# Patient Record
Sex: Female | Born: 1995 | Race: White | Hispanic: No | Smoking: Never smoker
Health system: Southern US, Community
[De-identification: ages and names within clinical notes are randomized; demographics above are authoritative.]

## PROBLEM LIST (undated history)

## (undated) DIAGNOSIS — D693 Immune thrombocytopenic purpura: Secondary | ICD-10-CM

## (undated) DIAGNOSIS — T1490XA Injury, unspecified, initial encounter: Secondary | ICD-10-CM

## (undated) DIAGNOSIS — D759 Disease of blood and blood-forming organs, unspecified: Secondary | ICD-10-CM

## (undated) DIAGNOSIS — Z8489 Family history of other specified conditions: Secondary | ICD-10-CM

## (undated) DIAGNOSIS — Z862 Personal history of diseases of the blood and blood-forming organs and certain disorders involving the immune mechanism: Secondary | ICD-10-CM

## (undated) DIAGNOSIS — D696 Thrombocytopenia, unspecified: Secondary | ICD-10-CM

## (undated) HISTORY — PX: ABSCESS DRAINAGE: SHX1119

## (undated) HISTORY — DX: Immune thrombocytopenic purpura: D69.3

## (undated) HISTORY — DX: Thrombocytopenia, unspecified: D69.6

## (undated) HISTORY — DX: Disease of blood and blood-forming organs, unspecified: D75.9

## (undated) HISTORY — PX: TONSILLECTOMY: SHX5217

## (undated) HISTORY — DX: Injury, unspecified, initial encounter: T14.90XA

---
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
## 2013-02-19 ENCOUNTER — Other Ambulatory Visit: Payer: Self-pay | Admitting: Physician Assistant

## 2013-02-19 DIAGNOSIS — D696 Thrombocytopenia, unspecified: Secondary | ICD-10-CM

## 2013-02-19 HISTORY — DX: Thrombocytopenia, unspecified: D69.6

## 2013-02-19 LAB — CBC WITH DIFFERENTIAL/PLATELET
Basophil %: 0.4 %
Eosinophil #: 0.1 10*3/uL (ref 0.0–0.7)
Eosinophil %: 1.1 %
HCT: 37.9 % (ref 35.0–47.0)
HGB: 12.7 g/dL (ref 12.0–16.0)
Lymphocyte #: 2.4 10*3/uL (ref 1.0–3.6)
Lymphocyte %: 30.6 %
MCV: 92 fL (ref 80–100)
Monocyte #: 0.5 x10 3/mm (ref 0.2–0.9)
Neutrophil #: 4.7 10*3/uL (ref 1.4–6.5)
Neutrophil %: 61.1 %
RBC: 4.1 10*6/uL (ref 3.80–5.20)
RDW: 13.1 % (ref 11.5–14.5)

## 2013-02-19 LAB — PROTIME-INR
INR: 1
Prothrombin Time: 13.5 secs (ref 11.5–14.7)

## 2013-02-21 ENCOUNTER — Ambulatory Visit: Payer: Self-pay | Admitting: Hematology and Oncology

## 2013-02-21 LAB — CBC CANCER CENTER
Basophil #: 0 x10 3/mm (ref 0.0–0.1)
Eosinophil %: 1.2 %
HCT: 36.8 % (ref 35.0–47.0)
Lymphocyte #: 1.7 x10 3/mm (ref 1.0–3.6)
Lymphocyte %: 24.7 %
MCHC: 34.9 g/dL (ref 32.0–36.0)
MCV: 92 fL (ref 80–100)
Monocyte #: 0.3 x10 3/mm (ref 0.2–0.9)
Neutrophil #: 4.7 x10 3/mm (ref 1.4–6.5)
Neutrophil %: 69.6 %
Platelet: 34 x10 3/mm — ABNORMAL LOW (ref 150–440)
RDW: 13 % (ref 11.5–14.5)
WBC: 6.7 x10 3/mm (ref 3.6–11.0)

## 2013-02-23 ENCOUNTER — Emergency Department: Payer: Self-pay | Admitting: Emergency Medicine

## 2013-02-23 LAB — CBC
MCH: 30.5 pg (ref 26.0–34.0)
MCV: 91 fL (ref 80–100)
RDW: 12.7 % (ref 11.5–14.5)
WBC: 10.6 10*3/uL (ref 3.6–11.0)

## 2013-02-26 LAB — CANCER CTR PLATELET CT: Platelet: 101 x10 3/mm — ABNORMAL LOW (ref 150–440)

## 2013-03-01 LAB — CBC CANCER CENTER
Eosinophil %: 0.4 %
HGB: 15.2 g/dL (ref 12.0–16.0)
Lymphocyte #: 1.9 x10 3/mm (ref 1.0–3.6)
Lymphocyte %: 15.5 %
MCH: 32 pg (ref 26.0–34.0)
MCV: 93 fL (ref 80–100)
Monocyte %: 4.6 %
Neutrophil #: 9.9 x10 3/mm — ABNORMAL HIGH (ref 1.4–6.5)
Neutrophil %: 79.3 %
Platelet: 224 x10 3/mm (ref 150–440)
RBC: 4.77 10*6/uL (ref 3.80–5.20)
RDW: 13.2 % (ref 11.5–14.5)
WBC: 12.5 x10 3/mm — ABNORMAL HIGH (ref 3.6–11.0)

## 2013-03-05 LAB — CBC CANCER CENTER
Eosinophil #: 0.1 x10 3/mm (ref 0.0–0.7)
HCT: 40.4 % (ref 35.0–47.0)
Lymphocyte %: 28 %
MCH: 31.7 pg (ref 26.0–34.0)
MCHC: 34 g/dL (ref 32.0–36.0)
Monocyte #: 0.7 x10 3/mm (ref 0.2–0.9)
Neutrophil #: 7.3 x10 3/mm — ABNORMAL HIGH (ref 1.4–6.5)
RBC: 4.32 10*6/uL (ref 3.80–5.20)
RDW: 13.8 % (ref 11.5–14.5)
WBC: 11.3 x10 3/mm — ABNORMAL HIGH (ref 3.6–11.0)

## 2013-03-16 ENCOUNTER — Ambulatory Visit: Payer: Self-pay | Admitting: Hematology and Oncology

## 2013-03-23 LAB — CBC CANCER CENTER
Basophil #: 0 x10 3/mm (ref 0.0–0.1)
Basophil %: 0.5 %
Eosinophil #: 0.1 x10 3/mm (ref 0.0–0.7)
Eosinophil %: 2.5 %
Lymphocyte %: 28.9 %
MCV: 92 fL (ref 80–100)
Monocyte #: 0.4 x10 3/mm (ref 0.2–0.9)
Monocyte %: 7.9 %
Neutrophil %: 60.2 %
Platelet: 60 x10 3/mm — ABNORMAL LOW (ref 150–440)

## 2013-03-27 LAB — CBC CANCER CENTER
Basophil #: 0 x10 3/mm (ref 0.0–0.1)
Basophil %: 0.5 %
Eosinophil %: 0.6 %
HGB: 13.7 g/dL (ref 12.0–16.0)
Lymphocyte #: 4.5 x10 3/mm — ABNORMAL HIGH (ref 1.0–3.6)
MCHC: 34.2 g/dL (ref 32.0–36.0)
MCV: 93 fL (ref 80–100)
Monocyte %: 8.6 %
Neutrophil #: 5.2 x10 3/mm (ref 1.4–6.5)
RBC: 4.29 10*6/uL (ref 3.80–5.20)
RDW: 13.6 % (ref 11.5–14.5)
WBC: 10.8 x10 3/mm (ref 3.6–11.0)

## 2013-03-29 LAB — CBC CANCER CENTER
Eosinophil #: 0.1 x10 3/mm (ref 0.0–0.7)
Eosinophil %: 1.1 %
HGB: 13.8 g/dL (ref 12.0–16.0)
Lymphocyte %: 28.7 %
Monocyte #: 1.1 x10 3/mm — ABNORMAL HIGH (ref 0.2–0.9)
Monocyte %: 8.6 %
Neutrophil #: 7.6 x10 3/mm — ABNORMAL HIGH (ref 1.4–6.5)
Neutrophil %: 61.2 %
Platelet: 163 x10 3/mm (ref 150–440)
RBC: 4.32 10*6/uL (ref 3.80–5.20)
RDW: 13.7 % (ref 11.5–14.5)

## 2013-04-02 LAB — CBC CANCER CENTER
Basophil #: 0.1 x10 3/mm (ref 0.0–0.1)
Basophil %: 0.5 %
Eosinophil #: 0.1 x10 3/mm (ref 0.0–0.7)
HCT: 40.8 % (ref 35.0–47.0)
HGB: 14 g/dL (ref 12.0–16.0)
Lymphocyte #: 5.7 x10 3/mm — ABNORMAL HIGH (ref 1.0–3.6)
MCH: 31.7 pg (ref 26.0–34.0)
Monocyte #: 1.1 x10 3/mm — ABNORMAL HIGH (ref 0.2–0.9)
Monocyte %: 6.8 %
RDW: 13.6 % (ref 11.5–14.5)
WBC: 15.5 x10 3/mm — ABNORMAL HIGH (ref 3.6–11.0)

## 2013-04-05 LAB — CBC CANCER CENTER
Basophil %: 0.3 %
Eosinophil #: 0 x10 3/mm (ref 0.0–0.7)
Eosinophil %: 0.3 %
HCT: 40.6 % (ref 35.0–47.0)
HGB: 13.7 g/dL (ref 12.0–16.0)
Lymphocyte #: 4.1 x10 3/mm — ABNORMAL HIGH (ref 1.0–3.6)
MCV: 93 fL (ref 80–100)
Monocyte #: 0.8 x10 3/mm (ref 0.2–0.9)
Neutrophil #: 11.9 x10 3/mm — ABNORMAL HIGH (ref 1.4–6.5)
Neutrophil %: 70.2 %
Platelet: 219 x10 3/mm (ref 150–440)
WBC: 16.9 x10 3/mm — ABNORMAL HIGH (ref 3.6–11.0)

## 2013-04-12 LAB — CBC CANCER CENTER
Basophil #: 0.1 x10 3/mm (ref 0.0–0.1)
Eosinophil #: 0.1 x10 3/mm (ref 0.0–0.7)
Eosinophil %: 0.9 %
HGB: 12.6 g/dL (ref 12.0–16.0)
Lymphocyte #: 4.1 x10 3/mm — ABNORMAL HIGH (ref 1.0–3.6)
MCH: 30.9 pg (ref 26.0–34.0)
MCV: 92 fL (ref 80–100)
Neutrophil %: 55.6 %
Platelet: 172 x10 3/mm (ref 150–440)
RDW: 13.5 % (ref 11.5–14.5)

## 2013-04-12 LAB — BASIC METABOLIC PANEL
BUN: 8 mg/dL (ref 7–18)
Chloride: 103 mmol/L (ref 98–107)
Co2: 29 mmol/L (ref 21–32)
Creatinine: 0.74 mg/dL (ref 0.60–1.30)
EGFR (Non-African Amer.): >60
Glucose: 80 mg/dL (ref 65–99)
Osmolality: 273 (ref 275–301)
Potassium: 4 mmol/L (ref 3.5–5.1)

## 2013-04-16 ENCOUNTER — Ambulatory Visit: Payer: Self-pay | Admitting: Hematology and Oncology

## 2013-04-18 LAB — CBC CANCER CENTER
Basophil #: 0 x10 3/mm (ref 0.0–0.1)
Basophil %: 0.4 %
Eosinophil #: 0.2 x10 3/mm (ref 0.0–0.7)
Lymphocyte #: 3.8 x10 3/mm — ABNORMAL HIGH (ref 1.0–3.6)
Lymphocyte %: 40 %
Monocyte #: 0.7 x10 3/mm (ref 0.2–0.9)
Neutrophil #: 4.8 x10 3/mm (ref 1.4–6.5)
Neutrophil %: 50.6 %
Platelet: 118 x10 3/mm — ABNORMAL LOW (ref 150–440)
RBC: 4.06 10*6/uL (ref 3.80–5.20)
RDW: 13.7 % (ref 11.5–14.5)
WBC: 9.6 x10 3/mm (ref 3.6–11.0)

## 2013-04-25 LAB — CBC CANCER CENTER
Basophil %: 0.6 %
Eosinophil #: 0.2 x10 3/mm (ref 0.0–0.7)
Lymphocyte #: 2.4 x10 3/mm (ref 1.0–3.6)
Lymphocyte %: 24.3 %
MCH: 30.9 pg (ref 26.0–34.0)
MCHC: 33.5 g/dL (ref 32.0–36.0)
MCV: 92 fL (ref 80–100)
Monocyte %: 7.9 %
Neutrophil #: 6.3 x10 3/mm (ref 1.4–6.5)
Neutrophil %: 65 %
Platelet: 133 x10 3/mm — ABNORMAL LOW (ref 150–440)
RBC: 4.55 10*6/uL (ref 3.80–5.20)
RDW: 13.7 % (ref 11.5–14.5)
WBC: 9.7 x10 3/mm (ref 3.6–11.0)

## 2013-05-02 LAB — CBC CANCER CENTER
Basophil #: 0 x10 3/mm (ref 0.0–0.1)
Basophil %: 0.2 %
HGB: 13.5 g/dL (ref 12.0–16.0)
MCHC: 33.5 g/dL (ref 32.0–36.0)
MCV: 92 fL (ref 80–100)
Monocyte #: 0.3 x10 3/mm (ref 0.2–0.9)
Monocyte %: 3 %
Neutrophil #: 8.7 x10 3/mm — ABNORMAL HIGH (ref 1.4–6.5)
Neutrophil %: 83.7 %
RDW: 13.4 % (ref 11.5–14.5)

## 2013-05-04 LAB — CBC CANCER CENTER
Basophil #: 0.1 x10 3/mm (ref 0.0–0.1)
Eosinophil #: 0 x10 3/mm (ref 0.0–0.7)
Eosinophil %: 0.2 %
Lymphocyte %: 37.4 %
MCH: 31.3 pg (ref 26.0–34.0)
MCHC: 33.9 g/dL (ref 32.0–36.0)
MCV: 92 fL (ref 80–100)
Monocyte #: 0.9 x10 3/mm (ref 0.2–0.9)
Neutrophil %: 53.8 %
RBC: 4.04 10*6/uL (ref 3.80–5.20)
RDW: 13.6 % (ref 11.5–14.5)
WBC: 11.3 x10 3/mm — ABNORMAL HIGH (ref 3.6–11.0)

## 2013-05-10 LAB — CBC CANCER CENTER
Eosinophil #: 0.1 x10 3/mm (ref 0.0–0.7)
MCHC: 35 g/dL (ref 32.0–36.0)
Neutrophil %: 57.6 %
RBC: 4.31 10*6/uL (ref 3.80–5.20)
RDW: 13.3 % (ref 11.5–14.5)
WBC: 7.5 x10 3/mm (ref 3.6–11.0)

## 2013-05-16 ENCOUNTER — Ambulatory Visit: Payer: Self-pay | Admitting: Hematology and Oncology

## 2013-05-17 LAB — CBC CANCER CENTER
Eosinophil %: 3.3 %
HCT: 36.8 % (ref 35.0–47.0)
HGB: 12.6 g/dL (ref 12.0–16.0)
Lymphocyte #: 2.1 x10 3/mm (ref 1.0–3.6)
MCHC: 34.4 g/dL (ref 32.0–36.0)
MCV: 92 fL (ref 80–100)
Monocyte #: 0.7 x10 3/mm (ref 0.2–0.9)
Monocyte %: 11.4 %
Neutrophil #: 3.4 x10 3/mm (ref 1.4–6.5)
Neutrophil %: 51.8 %
Platelet: 150 x10 3/mm (ref 150–440)
RBC: 4 10*6/uL (ref 3.80–5.20)
RDW: 13.2 % (ref 11.5–14.5)
WBC: 6.6 x10 3/mm (ref 3.6–11.0)

## 2013-05-23 ENCOUNTER — Emergency Department: Payer: Self-pay | Admitting: Emergency Medicine

## 2013-05-23 LAB — URINALYSIS, COMPLETE
Bilirubin,UR: NEGATIVE
Glucose,UR: NEGATIVE mg/dL (ref 0–75)
Leukocyte Esterase: NEGATIVE
Ph: 5 (ref 4.5–8.0)
RBC,UR: 3 /HPF (ref 0–5)
WBC UR: 4 /HPF (ref 0–5)

## 2013-05-23 LAB — CBC WITH DIFFERENTIAL/PLATELET
Basophil #: 0.1 10*3/uL (ref 0.0–0.1)
Eosinophil #: 0.2 10*3/uL (ref 0.0–0.7)
Eosinophil %: 2 %
HCT: 42.9 % (ref 35.0–47.0)
Lymphocyte #: 1.4 10*3/uL (ref 1.0–3.6)
Lymphocyte %: 16 %
MCHC: 34.4 g/dL (ref 32.0–36.0)
MCV: 90 fL (ref 80–100)
Monocyte #: 0.6 x10 3/mm (ref 0.2–0.9)
Neutrophil #: 6.6 10*3/uL — ABNORMAL HIGH (ref 1.4–6.5)
Neutrophil %: 74.1 %
Platelet: 113 10*3/uL — ABNORMAL LOW (ref 150–440)
WBC: 8.9 10*3/uL (ref 3.6–11.0)

## 2013-05-23 LAB — BASIC METABOLIC PANEL
BUN: 8 mg/dL (ref 7–18)
Calcium, Total: 9.3 mg/dL (ref 9.0–10.7)
Chloride: 104 mmol/L (ref 98–107)
Co2: 23 mmol/L (ref 21–32)
Creatinine: 0.67 mg/dL (ref 0.60–1.30)
EGFR (African American): >60
EGFR (Non-African Amer.): >60
Potassium: 4 mmol/L (ref 3.5–5.1)
Sodium: 134 mmol/L — ABNORMAL LOW (ref 136–145)

## 2013-05-25 LAB — BETA STREP CULTURE(ARMC)

## 2013-05-31 LAB — CBC CANCER CENTER
Eosinophil %: 4.6 %
HCT: 38.3 % (ref 35.0–47.0)
Lymphocyte #: 2.2 x10 3/mm (ref 1.0–3.6)
MCHC: 33.8 g/dL (ref 32.0–36.0)
MCV: 91 fL (ref 80–100)
Monocyte #: 0.7 x10 3/mm (ref 0.2–0.9)
Monocyte %: 9.5 %
Neutrophil %: 55.1 %
Platelet: 317 x10 3/mm (ref 150–440)
WBC: 7.5 x10 3/mm (ref 3.6–11.0)

## 2013-06-16 ENCOUNTER — Ambulatory Visit: Payer: Self-pay | Admitting: Hematology and Oncology

## 2013-06-19 LAB — CBC CANCER CENTER
Basophil %: 0.7 %
Eosinophil #: 0.2 x10 3/mm (ref 0.0–0.7)
HGB: 12.6 g/dL (ref 12.0–16.0)
Lymphocyte #: 1.5 x10 3/mm (ref 1.0–3.6)
MCH: 30.8 pg (ref 26.0–34.0)
MCV: 93 fL (ref 80–100)
Monocyte #: 0.5 x10 3/mm (ref 0.2–0.9)
Platelet: 227 x10 3/mm (ref 150–440)
RBC: 4.1 10*6/uL (ref 3.80–5.20)
RDW: 14.4 % (ref 11.5–14.5)
WBC: 5.2 x10 3/mm (ref 3.6–11.0)

## 2013-06-19 LAB — BASIC METABOLIC PANEL
Anion Gap: 9 (ref 7–16)
BUN: 6 mg/dL — ABNORMAL LOW (ref 7–18)
Calcium, Total: 8.7 mg/dL — ABNORMAL LOW (ref 9.0–10.7)
Chloride: 106 mmol/L (ref 98–107)
Co2: 24 mmol/L (ref 21–32)
EGFR (African American): >60
EGFR (Non-African Amer.): >60
Glucose: 90 mg/dL (ref 65–99)
Potassium: 3.8 mmol/L (ref 3.5–5.1)

## 2013-07-16 ENCOUNTER — Ambulatory Visit: Payer: Self-pay | Admitting: Hematology and Oncology

## 2013-07-24 LAB — CBC CANCER CENTER
Basophil #: 0.1 x10 3/mm (ref 0.0–0.1)
Eosinophil %: 2.7 %
HCT: 39.9 % (ref 35.0–47.0)
HGB: 13.3 g/dL (ref 12.0–16.0)
Lymphocyte %: 24.2 %
MCHC: 33.5 g/dL (ref 32.0–36.0)
MCV: 93 fL (ref 80–100)
Monocyte #: 0.6 x10 3/mm (ref 0.2–0.9)
Monocyte %: 9.2 %
Neutrophil %: 63.2 %
Platelet: 242 x10 3/mm (ref 150–440)
RBC: 4.29 10*6/uL (ref 3.80–5.20)
RDW: 13.7 % (ref 11.5–14.5)
WBC: 6.8 x10 3/mm (ref 3.6–11.0)

## 2013-08-16 ENCOUNTER — Ambulatory Visit: Payer: Self-pay | Admitting: Hematology and Oncology

## 2013-09-02 ENCOUNTER — Emergency Department: Payer: Self-pay | Admitting: Emergency Medicine

## 2013-09-02 LAB — CBC
HCT: 37.9 % (ref 35.0–47.0)
HGB: 12.8 g/dL (ref 12.0–16.0)
MCH: 30.6 pg (ref 26.0–34.0)
MCHC: 33.8 g/dL (ref 32.0–36.0)
MCV: 91 fL (ref 80–100)
PLATELETS: 288 10*3/uL (ref 150–440)
RBC: 4.19 10*6/uL (ref 3.80–5.20)
RDW: 12.6 % (ref 11.5–14.5)
WBC: 11.5 10*3/uL — ABNORMAL HIGH (ref 3.6–11.0)

## 2013-09-02 LAB — COMPREHENSIVE METABOLIC PANEL
Albumin: 3.2 g/dL — ABNORMAL LOW (ref 3.8–5.6)
Alkaline Phosphatase: 73 U/L
Anion Gap: 7 (ref 7–16)
BUN: 14 mg/dL (ref 7–18)
Bilirubin,Total: 0.2 mg/dL (ref 0.2–1.0)
CALCIUM: 8.9 mg/dL — AB (ref 9.0–10.7)
CO2: 24 mmol/L (ref 21–32)
Chloride: 106 mmol/L (ref 98–107)
Creatinine: 0.73 mg/dL (ref 0.60–1.30)
EGFR (African American): >60
Glucose: 96 mg/dL (ref 65–99)
OSMOLALITY: 274 (ref 275–301)
Potassium: 3.8 mmol/L (ref 3.5–5.1)
SGOT(AST): 22 U/L (ref 0–26)
SGPT (ALT): 20 U/L (ref 12–78)
SODIUM: 137 mmol/L (ref 136–145)
Total Protein: 7.6 g/dL (ref 6.4–8.6)

## 2013-11-01 ENCOUNTER — Ambulatory Visit: Payer: Self-pay | Admitting: Hematology and Oncology

## 2013-11-02 LAB — CBC CANCER CENTER
Basophil #: 0 x10 3/mm (ref 0.0–0.1)
Basophil %: 0.6 %
EOS ABS: 0.1 x10 3/mm (ref 0.0–0.7)
Eosinophil %: 1.8 %
HCT: 40.4 % (ref 35.0–47.0)
HGB: 13.3 g/dL (ref 12.0–16.0)
LYMPHS ABS: 1.5 x10 3/mm (ref 1.0–3.6)
LYMPHS PCT: 19.3 %
MCH: 30.1 pg (ref 26.0–34.0)
MCHC: 32.9 g/dL (ref 32.0–36.0)
MCV: 91 fL (ref 80–100)
Monocyte #: 0.6 x10 3/mm (ref 0.2–0.9)
Monocyte %: 7.6 %
NEUTROS ABS: 5.4 x10 3/mm (ref 1.4–6.5)
Neutrophil %: 70.7 %
Platelet: 271 x10 3/mm (ref 150–440)
RBC: 4.41 10*6/uL (ref 3.80–5.20)
RDW: 13.6 % (ref 11.5–14.5)
WBC: 7.6 x10 3/mm (ref 3.6–11.0)

## 2013-11-02 LAB — BASIC METABOLIC PANEL
ANION GAP: 9 (ref 7–16)
BUN: 9 mg/dL (ref 7–18)
CREATININE: 0.74 mg/dL (ref 0.60–1.30)
Calcium, Total: 9.2 mg/dL (ref 9.0–10.7)
Chloride: 103 mmol/L (ref 98–107)
Co2: 27 mmol/L (ref 21–32)
EGFR (Non-African Amer.): >60
Glucose: 85 mg/dL (ref 65–99)
Osmolality: 275 (ref 275–301)
Potassium: 4.4 mmol/L (ref 3.5–5.1)
Sodium: 139 mmol/L (ref 136–145)

## 2013-11-14 ENCOUNTER — Ambulatory Visit: Payer: Self-pay | Admitting: Hematology and Oncology

## 2013-12-30 ENCOUNTER — Emergency Department: Payer: Self-pay | Admitting: Internal Medicine

## 2013-12-30 LAB — CBC WITH DIFFERENTIAL/PLATELET
BASOS ABS: 0.1 10*3/uL (ref 0.0–0.1)
Basophil %: 0.6 %
EOS ABS: 0.3 10*3/uL (ref 0.0–0.7)
EOS PCT: 3 %
HCT: 39.7 % (ref 35.0–47.0)
HGB: 13.5 g/dL (ref 12.0–16.0)
LYMPHS ABS: 1.6 10*3/uL (ref 1.0–3.6)
Lymphocyte %: 17.5 %
MCH: 31.5 pg (ref 26.0–34.0)
MCHC: 34 g/dL (ref 32.0–36.0)
MCV: 93 fL (ref 80–100)
MONO ABS: 0.6 x10 3/mm (ref 0.2–0.9)
MONOS PCT: 6.4 %
Neutrophil #: 6.8 10*3/uL — ABNORMAL HIGH (ref 1.4–6.5)
Neutrophil %: 72.5 %
Platelet: 236 10*3/uL (ref 150–440)
RBC: 4.28 10*6/uL (ref 3.80–5.20)
RDW: 13.5 % (ref 11.5–14.5)
WBC: 9.4 10*3/uL (ref 3.6–11.0)

## 2013-12-30 LAB — URINALYSIS, COMPLETE
Bacteria: NONE SEEN
Bilirubin,UR: NEGATIVE
GLUCOSE, UR: NEGATIVE mg/dL (ref 0–75)
Leukocyte Esterase: NEGATIVE
NITRITE: NEGATIVE
PH: 6 (ref 4.5–8.0)
PROTEIN: NEGATIVE
RBC,UR: 1 /HPF (ref 0–5)
Specific Gravity: 1.019 (ref 1.003–1.030)
Squamous Epithelial: 2
WBC UR: NONE SEEN /HPF (ref 0–5)

## 2013-12-30 LAB — COMPREHENSIVE METABOLIC PANEL
ALBUMIN: 3.5 g/dL — AB (ref 3.8–5.6)
ALK PHOS: 80 U/L
ALT: 19 U/L (ref 12–78)
ANION GAP: 6 — AB (ref 7–16)
BUN: 6 mg/dL — ABNORMAL LOW (ref 7–18)
Bilirubin,Total: 0.4 mg/dL (ref 0.2–1.0)
CALCIUM: 9.1 mg/dL (ref 9.0–10.7)
CO2: 24 mmol/L (ref 21–32)
Chloride: 107 mmol/L (ref 98–107)
Creatinine: 0.79 mg/dL (ref 0.60–1.30)
Glucose: 113 mg/dL — ABNORMAL HIGH (ref 65–99)
Osmolality: 272 (ref 275–301)
POTASSIUM: 3.6 mmol/L (ref 3.5–5.1)
SGOT(AST): 24 U/L (ref 0–26)
Sodium: 137 mmol/L (ref 136–145)
TOTAL PROTEIN: 7.7 g/dL (ref 6.4–8.6)

## 2014-02-04 ENCOUNTER — Ambulatory Visit: Payer: Self-pay | Admitting: Hematology and Oncology

## 2014-02-05 LAB — CBC CANCER CENTER
Basophil #: 0 x10 3/mm (ref 0.0–0.1)
Basophil %: 0.7 %
EOS ABS: 0.1 x10 3/mm (ref 0.0–0.7)
Eosinophil %: 1.8 %
HCT: 41.4 % (ref 35.0–47.0)
HGB: 13.5 g/dL (ref 12.0–16.0)
Lymphocyte #: 1.8 x10 3/mm (ref 1.0–3.6)
Lymphocyte %: 27 %
MCH: 31 pg (ref 26.0–34.0)
MCHC: 32.7 g/dL (ref 32.0–36.0)
MCV: 95 fL (ref 80–100)
MONOS PCT: 7.4 %
Monocyte #: 0.5 x10 3/mm (ref 0.2–0.9)
Neutrophil #: 4.3 x10 3/mm (ref 1.4–6.5)
Neutrophil %: 63.1 %
PLATELETS: 256 x10 3/mm (ref 150–440)
RBC: 4.37 10*6/uL (ref 3.80–5.20)
RDW: 13.3 % (ref 11.5–14.5)
WBC: 6.8 x10 3/mm (ref 3.6–11.0)

## 2014-02-05 LAB — BASIC METABOLIC PANEL
Anion Gap: 7 (ref 7–16)
BUN: 7 mg/dL (ref 7–18)
CALCIUM: 9.7 mg/dL (ref 9.0–10.7)
Chloride: 105 mmol/L (ref 98–107)
Co2: 28 mmol/L (ref 21–32)
Creatinine: 0.59 mg/dL — ABNORMAL LOW (ref 0.60–1.30)
EGFR (African American): >60
EGFR (Non-African Amer.): >60
Glucose: 78 mg/dL (ref 65–99)
OSMOLALITY: 276 (ref 275–301)
Potassium: 4.4 mmol/L (ref 3.5–5.1)
Sodium: 140 mmol/L (ref 136–145)

## 2014-02-13 ENCOUNTER — Ambulatory Visit: Payer: Self-pay | Admitting: Hematology and Oncology

## 2014-04-05 ENCOUNTER — Emergency Department: Payer: Self-pay | Admitting: Internal Medicine

## 2014-04-17 ENCOUNTER — Emergency Department: Payer: Self-pay | Admitting: Emergency Medicine

## 2014-04-17 LAB — URINALYSIS, COMPLETE
BACTERIA: NONE SEEN
BILIRUBIN, UR: NEGATIVE
Blood: NEGATIVE
GLUCOSE, UR: NEGATIVE mg/dL (ref 0–75)
LEUKOCYTE ESTERASE: NEGATIVE
NITRITE: NEGATIVE
PROTEIN: NEGATIVE
Ph: 6 (ref 4.5–8.0)
RBC,UR: 3 /HPF (ref 0–5)
Specific Gravity: 1.025 (ref 1.003–1.030)

## 2014-04-17 LAB — BASIC METABOLIC PANEL
ANION GAP: 7 (ref 7–16)
BUN: 9 mg/dL (ref 7–18)
Calcium, Total: 8.6 mg/dL (ref 8.5–10.1)
Chloride: 106 mmol/L (ref 98–107)
Co2: 26 mmol/L (ref 21–32)
Creatinine: 0.74 mg/dL (ref 0.60–1.30)
EGFR (African American): >60
EGFR (Non-African Amer.): >60
Glucose: 95 mg/dL (ref 65–99)
Osmolality: 276 (ref 275–301)
Potassium: 4 mmol/L (ref 3.5–5.1)
SODIUM: 139 mmol/L (ref 136–145)

## 2014-04-17 LAB — CBC WITH DIFFERENTIAL/PLATELET
Basophil #: 0.1 10*3/uL (ref 0.0–0.1)
Basophil %: 0.5 %
EOS ABS: 0 10*3/uL (ref 0.0–0.7)
EOS PCT: 0.4 %
HCT: 39.7 % (ref 35.0–47.0)
HGB: 12.9 g/dL (ref 12.0–16.0)
LYMPHS ABS: 1.2 10*3/uL (ref 1.0–3.6)
Lymphocyte %: 11.1 %
MCH: 31.1 pg (ref 26.0–34.0)
MCHC: 32.6 g/dL (ref 32.0–36.0)
MCV: 96 fL (ref 80–100)
MONO ABS: 0.4 x10 3/mm (ref 0.2–0.9)
Monocyte %: 3.4 %
Neutrophil #: 9 10*3/uL — ABNORMAL HIGH (ref 1.4–6.5)
Neutrophil %: 84.6 %
Platelet: 219 10*3/uL (ref 150–440)
RBC: 4.15 10*6/uL (ref 3.80–5.20)
RDW: 13.1 % (ref 11.5–14.5)
WBC: 10.7 10*3/uL (ref 3.6–11.0)

## 2014-04-18 ENCOUNTER — Ambulatory Visit: Payer: Self-pay | Admitting: Hematology and Oncology

## 2014-04-18 LAB — CBC CANCER CENTER
Basophil #: 0.1 x10 3/mm (ref 0.0–0.1)
Basophil %: 0.7 %
EOS ABS: 0.1 x10 3/mm (ref 0.0–0.7)
EOS PCT: 1.2 %
HCT: 39.9 % (ref 35.0–47.0)
HGB: 13.1 g/dL (ref 12.0–16.0)
LYMPHS ABS: 2.3 x10 3/mm (ref 1.0–3.6)
Lymphocyte %: 28.2 %
MCH: 31.3 pg (ref 26.0–34.0)
MCHC: 32.8 g/dL (ref 32.0–36.0)
MCV: 96 fL (ref 80–100)
MONOS PCT: 4.9 %
Monocyte #: 0.4 x10 3/mm (ref 0.2–0.9)
NEUTROS ABS: 5.2 x10 3/mm (ref 1.4–6.5)
NEUTROS PCT: 65 %
PLATELETS: 217 x10 3/mm (ref 150–440)
RBC: 4.18 10*6/uL (ref 3.80–5.20)
RDW: 13.4 % (ref 11.5–14.5)
WBC: 8 x10 3/mm (ref 3.6–11.0)

## 2014-04-18 LAB — COMPREHENSIVE METABOLIC PANEL
ALK PHOS: 65 U/L
AST: 14 U/L — AB (ref 15–37)
Albumin: 3.2 g/dL — ABNORMAL LOW (ref 3.4–5.0)
Anion Gap: 10 (ref 7–16)
BUN: 8 mg/dL (ref 7–18)
Bilirubin,Total: 0.2 mg/dL (ref 0.2–1.0)
CREATININE: 0.87 mg/dL (ref 0.60–1.30)
Calcium, Total: 8.6 mg/dL (ref 8.5–10.1)
Chloride: 104 mmol/L (ref 98–107)
Co2: 27 mmol/L (ref 21–32)
EGFR (African American): >60
EGFR (Non-African Amer.): >60
Glucose: 100 mg/dL — ABNORMAL HIGH (ref 65–99)
Osmolality: 280 (ref 275–301)
POTASSIUM: 4.1 mmol/L (ref 3.5–5.1)
SGPT (ALT): 19 U/L
Sodium: 141 mmol/L (ref 136–145)
Total Protein: 7 g/dL (ref 6.4–8.2)

## 2014-05-13 ENCOUNTER — Emergency Department: Payer: Self-pay | Admitting: Emergency Medicine

## 2014-05-16 ENCOUNTER — Ambulatory Visit: Payer: Self-pay | Admitting: Hematology and Oncology

## 2014-06-15 ENCOUNTER — Emergency Department: Payer: Self-pay | Admitting: Emergency Medicine

## 2014-06-15 LAB — CBC WITH DIFFERENTIAL/PLATELET
Basophil #: 0.1 10*3/uL (ref 0.0–0.1)
Basophil %: 0.8 %
EOS PCT: 1.4 %
Eosinophil #: 0.1 10*3/uL (ref 0.0–0.7)
HCT: 38.2 % (ref 35.0–47.0)
HGB: 12.7 g/dL (ref 12.0–16.0)
Lymphocyte #: 2.6 10*3/uL (ref 1.0–3.6)
Lymphocyte %: 27.5 %
MCH: 31.4 pg (ref 26.0–34.0)
MCHC: 33.2 g/dL (ref 32.0–36.0)
MCV: 95 fL (ref 80–100)
MONO ABS: 0.6 x10 3/mm (ref 0.2–0.9)
Monocyte %: 6.2 %
Neutrophil #: 5.9 10*3/uL (ref 1.4–6.5)
Neutrophil %: 64.1 %
Platelet: 283 10*3/uL (ref 150–440)
RBC: 4.03 10*6/uL (ref 3.80–5.20)
RDW: 13.5 % (ref 11.5–14.5)
WBC: 9.3 10*3/uL (ref 3.6–11.0)

## 2014-06-15 LAB — URINALYSIS, COMPLETE
Bilirubin,UR: NEGATIVE
Blood: NEGATIVE
Glucose,UR: NEGATIVE mg/dL (ref 0–75)
Ketone: NEGATIVE
Leukocyte Esterase: NEGATIVE
Nitrite: NEGATIVE
Ph: 5 (ref 4.5–8.0)
Protein: NEGATIVE
RBC,UR: 2 /HPF (ref 0–5)
SPECIFIC GRAVITY: 1.017 (ref 1.003–1.030)
Squamous Epithelial: 3
WBC UR: 2 /HPF (ref 0–5)

## 2014-06-15 LAB — COMPREHENSIVE METABOLIC PANEL
AST: 64 U/L — AB (ref 15–37)
Albumin: 3.1 g/dL — ABNORMAL LOW (ref 3.4–5.0)
Alkaline Phosphatase: 69 U/L
Anion Gap: 8 (ref 7–16)
BUN: 10 mg/dL (ref 7–18)
Bilirubin,Total: 0.3 mg/dL (ref 0.2–1.0)
CALCIUM: 8.3 mg/dL — AB (ref 8.5–10.1)
CREATININE: 0.34 mg/dL — AB (ref 0.60–1.30)
Chloride: 107 mmol/L (ref 98–107)
Co2: 21 mmol/L (ref 21–32)
EGFR (Non-African Amer.): >60
Glucose: 79 mg/dL (ref 65–99)
Osmolality: 270 (ref 275–301)
Potassium: 5.7 mmol/L — ABNORMAL HIGH (ref 3.5–5.1)
SGPT (ALT): 20 U/L
SODIUM: 136 mmol/L (ref 136–145)
Total Protein: 7.4 g/dL (ref 6.4–8.2)

## 2014-06-15 LAB — LIPASE, BLOOD: LIPASE: 74 U/L (ref 73–393)

## 2014-06-18 ENCOUNTER — Ambulatory Visit: Payer: Self-pay | Admitting: Hematology and Oncology

## 2014-06-18 LAB — CBC CANCER CENTER
BASOS ABS: 0 x10 3/mm (ref 0.0–0.1)
Basophil %: 0.7 %
EOS PCT: 2 %
Eosinophil #: 0.2 x10 3/mm (ref 0.0–0.7)
HCT: 40.3 % (ref 35.0–47.0)
HGB: 13.2 g/dL (ref 12.0–16.0)
Lymphocyte #: 2.1 x10 3/mm (ref 1.0–3.6)
Lymphocyte %: 27.1 %
MCH: 31.2 pg (ref 26.0–34.0)
MCHC: 32.8 g/dL (ref 32.0–36.0)
MCV: 95 fL (ref 80–100)
Monocyte #: 0.4 x10 3/mm (ref 0.2–0.9)
Monocyte %: 5.6 %
NEUTROS PCT: 64.6 %
Neutrophil #: 4.9 x10 3/mm (ref 1.4–6.5)
PLATELETS: 252 x10 3/mm (ref 150–440)
RBC: 4.24 10*6/uL (ref 3.80–5.20)
RDW: 13.4 % (ref 11.5–14.5)
WBC: 7.6 x10 3/mm (ref 3.6–11.0)

## 2014-07-16 ENCOUNTER — Ambulatory Visit: Payer: Self-pay | Admitting: Hematology and Oncology

## 2014-08-22 ENCOUNTER — Emergency Department: Payer: Self-pay | Admitting: Emergency Medicine

## 2014-08-22 LAB — URINALYSIS, COMPLETE
Bilirubin,UR: NEGATIVE
Blood: NEGATIVE
Glucose,UR: NEGATIVE mg/dL (ref 0–75)
Hyaline Cast: 11
Ketone: NEGATIVE
Leukocyte Esterase: NEGATIVE
NITRITE: NEGATIVE
Ph: 5 (ref 4.5–8.0)
RBC,UR: 1 /HPF (ref 0–5)
Specific Gravity: 1.027 (ref 1.003–1.030)
WBC UR: 5 /HPF (ref 0–5)

## 2014-08-22 LAB — BASIC METABOLIC PANEL
Anion Gap: 6 — ABNORMAL LOW (ref 7–16)
BUN: 7 mg/dL (ref 7–18)
Calcium, Total: 8.8 mg/dL (ref 8.5–10.1)
Chloride: 103 mmol/L (ref 98–107)
Co2: 28 mmol/L (ref 21–32)
Creatinine: 0.85 mg/dL (ref 0.60–1.30)
EGFR (African American): >60
GLUCOSE: 106 mg/dL — AB (ref 65–99)
OSMOLALITY: 272 (ref 275–301)
Potassium: 3.9 mmol/L (ref 3.5–5.1)
Sodium: 137 mmol/L (ref 136–145)

## 2014-08-22 LAB — DRUG SCREEN, URINE
AMPHETAMINES, UR SCREEN: NEGATIVE (ref ?–1000)
BENZODIAZEPINE, UR SCRN: NEGATIVE (ref ?–200)
Barbiturates, Ur Screen: NEGATIVE (ref ?–200)
Cannabinoid 50 Ng, Ur ~~LOC~~: POSITIVE (ref ?–50)
Cocaine Metabolite,Ur ~~LOC~~: NEGATIVE (ref ?–300)
MDMA (Ecstasy)Ur Screen: NEGATIVE (ref ?–500)
METHADONE, UR SCREEN: NEGATIVE (ref ?–300)
Opiate, Ur Screen: POSITIVE (ref ?–300)
Phencyclidine (PCP) Ur S: NEGATIVE (ref ?–25)
Tricyclic, Ur Screen: NEGATIVE (ref ?–1000)

## 2014-08-22 LAB — CBC WITH DIFFERENTIAL/PLATELET
Basophil #: 0 10*3/uL (ref 0.0–0.1)
Basophil %: 0.4 %
EOS PCT: 1.4 %
Eosinophil #: 0.1 10*3/uL (ref 0.0–0.7)
HCT: 43.2 % (ref 35.0–47.0)
HGB: 14 g/dL (ref 12.0–16.0)
LYMPHS ABS: 3.7 10*3/uL — AB (ref 1.0–3.6)
Lymphocyte %: 36.9 %
MCH: 31.3 pg (ref 26.0–34.0)
MCHC: 32.5 g/dL (ref 32.0–36.0)
MCV: 96 fL (ref 80–100)
MONO ABS: 0.6 x10 3/mm (ref 0.2–0.9)
MONOS PCT: 6.5 %
NEUTROS PCT: 54.8 %
Neutrophil #: 5.5 10*3/uL (ref 1.4–6.5)
Platelet: 253 10*3/uL (ref 150–440)
RBC: 4.49 10*6/uL (ref 3.80–5.20)
RDW: 13.4 % (ref 11.5–14.5)
WBC: 10 10*3/uL (ref 3.6–11.0)

## 2014-09-17 ENCOUNTER — Emergency Department: Payer: Self-pay | Admitting: Internal Medicine

## 2014-09-20 ENCOUNTER — Emergency Department: Payer: Self-pay | Admitting: Emergency Medicine

## 2014-10-25 ENCOUNTER — Emergency Department: Payer: Self-pay | Admitting: Student

## 2014-10-31 ENCOUNTER — Ambulatory Visit
Admit: 2014-10-31 | Disposition: A | Discharge status: Home / Self Care | Payer: Self-pay | Attending: Hematology and Oncology | Admitting: Hematology and Oncology

## 2014-11-15 ENCOUNTER — Ambulatory Visit
Admit: 2014-11-15 | Disposition: A | Discharge status: Home / Self Care | Payer: Self-pay | Attending: Hematology and Oncology | Admitting: Hematology and Oncology

## 2014-11-26 LAB — CREATININE, SERUM: Creatine, Serum: 0.64

## 2014-12-06 NOTE — Consult Note (Signed)
Brief Consult Note: Diagnosis: ITP.   Comments: Patient platelet ocunt 28 000 today despite 2 days of oral Prednisone. Will switch to methylprednisone 15mg /kg iv for 2 days then Prednisone 80mg  po daily. Recheck CBC on 03/05/13.  Electronic Signatures: Antony Hasteamiah, Levaughn Puccinelli S (MD)  (Signed 11-Jul-14 13:38)  Authored: Brief Consult Note   Last Updated: 11-Jul-14 13:38 by Antony Hasteamiah, Amos Micheals S (MD)

## 2014-12-11 ENCOUNTER — Other Ambulatory Visit: Payer: Self-pay | Admitting: Hematology and Oncology

## 2014-12-11 ENCOUNTER — Encounter: Payer: Self-pay | Admitting: Hematology and Oncology

## 2014-12-11 DIAGNOSIS — D693 Immune thrombocytopenic purpura: Secondary | ICD-10-CM

## 2014-12-11 HISTORY — DX: Immune thrombocytopenic purpura: D69.3

## 2014-12-17 ENCOUNTER — Other Ambulatory Visit: Payer: Self-pay

## 2014-12-17 ENCOUNTER — Ambulatory Visit: Payer: Self-pay | Admitting: Hematology and Oncology

## 2015-03-24 ENCOUNTER — Emergency Department
Admission: EM | Admit: 2015-03-24 | Discharge: 2015-03-24 | Disposition: A | Discharge status: Home / Self Care | Payer: Self-pay | Attending: Emergency Medicine | Admitting: Emergency Medicine

## 2015-03-24 ENCOUNTER — Encounter: Payer: Self-pay | Admitting: Emergency Medicine

## 2015-03-24 DIAGNOSIS — K529 Noninfective gastroenteritis and colitis, unspecified: Secondary | ICD-10-CM | POA: Insufficient documentation

## 2015-03-24 LAB — URINALYSIS COMPLETE WITH MICROSCOPIC (ARMC ONLY)
BACTERIA UA: NONE SEEN
Bilirubin Urine: NEGATIVE
GLUCOSE, UA: NEGATIVE mg/dL
Leukocytes, UA: NEGATIVE
Nitrite: NEGATIVE
PH: 5 (ref 5.0–8.0)
Protein, ur: 30 mg/dL — AB
Specific Gravity, Urine: 1.035 — ABNORMAL HIGH (ref 1.005–1.030)

## 2015-03-24 MED ORDER — ONDANSETRON HCL 4 MG PO TABS: 4.0000 mg | ORAL_TABLET | Freq: Every day | ORAL | Status: DC | PRN

## 2015-03-24 NOTE — ED Notes (Signed)
Has had some nausea and vomiting .thinks she may be dehydrated.  Last time vomited was yesterday

## 2015-03-24 NOTE — Discharge Instructions (Signed)

## 2015-03-24 NOTE — ED Provider Notes (Signed)
[facility] Emergency Department Provider Note  ____________________________________________  Time seen: 10 AM  I have reviewed the triage vital signs and the nursing notes.   HISTORY  Chief Complaint Emesis    HPI NACHELLE NEGRETTE is a 19 y.o. female who complains of nausea vomiting and diarrhea for 24 hours. She reports she developed nausea yesterday which was then followed by vomiting followed by watery diarrhea. She does have some intermittent abdominal cramping and no pain currently. She denies fevers. She does report myalgias. She has a sister with similar complaints. She is concerned she may be dehydrated     Past Medical History  Diagnosis Date  . Thrombocytopenia 02/19/2013  . 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 No pertinent past surgical history.  Current Outpatient Rx  Name  Route  Sig  Dispense  Refill  . ondansetron (ZOFRAN) 4 MG tablet   Oral   Take 1 tablet (4 mg total) by mouth daily as needed for nausea or vomiting.   20 tablet   1     Allergies Sulfa antibiotics  No family history on file.  Social History History  Substance Use Topics  . Smoking status: Never Smoker   . Smokeless tobacco: Not on file  . Alcohol Use: No    Review of Systems  Constitutional: Negative for fever. Eyes: Negative for visual changes. ENT: Negative for sore throat Cardiovascular: Negative for chest pain. Respiratory: Negative for shortness of breath. Gastrointestinal: Positive for abdominal cramping, vomiting and diarrhea Genitourinary: Negative for dysuria. Musculoskeletal: Positive for myalgias Skin: Negative for rash. Neurological: Negative for headaches or focal weakness Psychiatric: Mild anxiety    ____________________________________________   PHYSICAL EXAM:  VITAL  SIGNS: ED Triage Vitals  Enc Vitals Group     BP 03/24/15 0951 125/81 mmHg     Pulse Rate 03/24/15 0951 76     Resp 03/24/15 0951 18     Temp 03/24/15 0951 98.6 F (37 C)     Temp Source 03/24/15 0951 Oral     SpO2 03/24/15 0951 100 %     Weight 03/24/15 0951 199 lb (90.266 kg)     Height 03/24/15 0951 5\' 3"  (1.6 m)     Head Cir --      Peak Flow --      Pain Score --      Pain Loc --      Pain Edu? --      Excl. in GC? --      Constitutional: Alert and oriented. Well appearing and in no distress. Eyes: Conjunctivae are normal.  ENT   Head: Normocephalic and atraumatic.   Mouth/Throat: Mucous membranes are moist. Cardiovascular: Normal rate, regular rhythm. Normal and symmetric distal pulses are present in all extremities.  Respiratory: Normal respiratory effort without tachypnea nor retractions.  Gastrointestinal: Soft and non-tender in all quadrants. No distention. There is no CVA tenderness. Genitourinary: deferred Musculoskeletal: Nontender with normal range of motion in all extremities. No lower extremity tenderness nor edema. Neurologic:  Normal speech and language. No gross focal neurologic deficits are appreciated. Skin:  Skin is warm, dry and intact. No rash noted. Psychiatric: Mood and affect are normal. Patient exhibits appropriate insight and judgment.  ____________________________________________    LABS (pertinent positives/negatives)  Labs Reviewed  URINALYSIS COMPLETEWITH MICROSCOPIC ([facility] ONLY) - Abnormal; Notable for the following:  Color, Urine AMBER (*)    APPearance CLEAR (*)    Ketones, ur 2+ (*)    Specific Gravity, Urine 1.035 (*)    Hgb urine dipstick 2+ (*)    Protein, ur 30 (*)    Squamous Epithelial / LPF 0-5 (*)    All other components within normal limits  POC URINE PREG, ED    ____________________________________________   EKG  None  ____________________________________________    RADIOLOGY I have personally  reviewed any xrays that were ordered on this patient: None  ____________________________________________   PROCEDURES  Procedure(s) performed: none  Critical Care performed: none  ____________________________________________   INITIAL IMPRESSION / ASSESSMENT AND PLAN / ED COURSE  Pertinent labs & imaging results that were available during my care of the patient were reviewed by me and considered in my medical decision making (see chart for details).  Patient well-appearing with normal vitals and in no distress. Her abdominal exam is benign. She has no evidence of dehydration. I'll prescribe Zofran recommend supportive care. Follow-up with PCP.  ____________________________________________   FINAL CLINICAL IMPRESSION(S) / ED DIAGNOSES  Final diagnoses:  Gastroenteritis     Jene Every, MD 03/24/15 1136

## 2015-04-28 ENCOUNTER — Emergency Department
Admission: EM | Admit: 2015-04-28 | Discharge: 2015-04-28 | Discharge status: Against Medical Advice | Payer: Self-pay | Attending: Emergency Medicine | Admitting: Emergency Medicine

## 2015-04-28 ENCOUNTER — Encounter: Payer: Self-pay | Admitting: Emergency Medicine

## 2015-04-28 DIAGNOSIS — R1110 Vomiting, unspecified: Secondary | ICD-10-CM | POA: Insufficient documentation

## 2015-04-28 DIAGNOSIS — R5383 Other fatigue: Secondary | ICD-10-CM | POA: Insufficient documentation

## 2015-04-28 LAB — CBC WITH DIFFERENTIAL/PLATELET
Basophils Absolute: 0 10*3/uL (ref 0–0.1)
Basophils Relative: 1 %
Eosinophils Absolute: 0 10*3/uL (ref 0–0.7)
Eosinophils Relative: 1 %
HEMATOCRIT: 41.7 % (ref 35.0–47.0)
Hemoglobin: 13.9 g/dL (ref 12.0–16.0)
LYMPHS PCT: 23 %
Lymphs Abs: 1.7 10*3/uL (ref 1.0–3.6)
MCH: 31.5 pg (ref 26.0–34.0)
MCHC: 33.3 g/dL (ref 32.0–36.0)
MCV: 94.6 fL (ref 80.0–100.0)
MONO ABS: 0.4 10*3/uL (ref 0.2–0.9)
MONOS PCT: 5 %
NEUTROS ABS: 5.2 10*3/uL (ref 1.4–6.5)
Neutrophils Relative %: 70 %
Platelets: 234 10*3/uL (ref 150–440)
RBC: 4.41 MIL/uL (ref 3.80–5.20)
RDW: 13 % (ref 11.5–14.5)
WBC: 7.3 10*3/uL (ref 3.6–11.0)

## 2015-04-28 LAB — COMPREHENSIVE METABOLIC PANEL
ALT: 13 U/L — ABNORMAL LOW (ref 14–54)
ANION GAP: 7 (ref 5–15)
AST: 19 U/L (ref 15–41)
Albumin: 4.1 g/dL (ref 3.5–5.0)
Alkaline Phosphatase: 68 U/L (ref 38–126)
BILIRUBIN TOTAL: 0.5 mg/dL (ref 0.3–1.2)
BUN: 10 mg/dL (ref 6–20)
CO2: 24 mmol/L (ref 22–32)
Calcium: 9.3 mg/dL (ref 8.9–10.3)
Chloride: 104 mmol/L (ref 101–111)
Creatinine, Ser: 0.67 mg/dL (ref 0.44–1.00)
GFR calc Af Amer: >60 mL/min (ref >60)
Glucose, Bld: 92 mg/dL (ref 65–99)
POTASSIUM: 4.2 mmol/L (ref 3.5–5.1)
Sodium: 135 mmol/L (ref 135–145)
TOTAL PROTEIN: 7.7 g/dL (ref 6.5–8.1)

## 2015-04-28 LAB — URINALYSIS COMPLETE WITH MICROSCOPIC (ARMC ONLY)
BILIRUBIN URINE: NEGATIVE
GLUCOSE, UA: NEGATIVE mg/dL
KETONES UR: NEGATIVE mg/dL
LEUKOCYTES UA: NEGATIVE
NITRITE: NEGATIVE
PH: 7 (ref 5.0–8.0)
Protein, ur: NEGATIVE mg/dL
Specific Gravity, Urine: 1.004 — ABNORMAL LOW (ref 1.005–1.030)

## 2015-04-28 LAB — POCT PREGNANCY, URINE: Preg Test, Ur: NEGATIVE

## 2015-04-28 LAB — LIPASE, BLOOD: LIPASE: 21 U/L — AB (ref 22–51)

## 2015-04-28 NOTE — ED Notes (Signed)
Reports n/v randomly over the past few wks.  Generalized fatigue.  Skin w/d with good color.

## 2015-04-29 ENCOUNTER — Emergency Department: Payer: Self-pay

## 2015-04-29 ENCOUNTER — Encounter: Payer: Self-pay | Admitting: Emergency Medicine

## 2015-04-29 ENCOUNTER — Emergency Department
Admission: EM | Admit: 2015-04-29 | Discharge: 2015-04-29 | Disposition: A | Discharge status: Home / Self Care | Payer: Self-pay | Attending: Emergency Medicine | Admitting: Emergency Medicine

## 2015-04-29 DIAGNOSIS — R52 Pain, unspecified: Secondary | ICD-10-CM

## 2015-04-29 DIAGNOSIS — Z79899 Other long term (current) drug therapy: Secondary | ICD-10-CM | POA: Insufficient documentation

## 2015-04-29 DIAGNOSIS — R112 Nausea with vomiting, unspecified: Secondary | ICD-10-CM | POA: Insufficient documentation

## 2015-04-29 DIAGNOSIS — R109 Unspecified abdominal pain: Secondary | ICD-10-CM | POA: Insufficient documentation

## 2015-04-29 LAB — COMPREHENSIVE METABOLIC PANEL WITH GFR
ALT: 12 U/L — ABNORMAL LOW (ref 14–54)
AST: 21 U/L (ref 15–41)
Albumin: 4 g/dL (ref 3.5–5.0)
Alkaline Phosphatase: 71 U/L (ref 38–126)
Anion gap: 5 (ref 5–15)
BUN: 10 mg/dL (ref 6–20)
CO2: 26 mmol/L (ref 22–32)
Calcium: 9.3 mg/dL (ref 8.9–10.3)
Chloride: 107 mmol/L (ref 101–111)
Creatinine, Ser: 0.64 mg/dL (ref 0.44–1.00)
GFR calc Af Amer: >60 mL/min
GFR calc non Af Amer: >60 mL/min
Glucose, Bld: 88 mg/dL (ref 65–99)
Potassium: 4.5 mmol/L (ref 3.5–5.1)
Sodium: 138 mmol/L (ref 135–145)
Total Bilirubin: 0.6 mg/dL (ref 0.3–1.2)
Total Protein: 7.7 g/dL (ref 6.5–8.1)

## 2015-04-29 LAB — CBC
HCT: 41.5 % (ref 35.0–47.0)
Hemoglobin: 13.6 g/dL (ref 12.0–16.0)
MCH: 31.2 pg (ref 26.0–34.0)
MCHC: 32.7 g/dL (ref 32.0–36.0)
MCV: 95.4 fL (ref 80.0–100.0)
Platelets: 229 K/uL (ref 150–440)
RBC: 4.34 MIL/uL (ref 3.80–5.20)
RDW: 13.1 % (ref 11.5–14.5)
WBC: 5.7 K/uL (ref 3.6–11.0)

## 2015-04-29 LAB — LIPASE, BLOOD: Lipase: 20 U/L — ABNORMAL LOW (ref 22–51)

## 2015-04-29 NOTE — ED Provider Notes (Signed)
[facility] Emergency Department Provider Note  ____________________________________________  Time seen: Approximately 10:44 AM  I have reviewed the triage vital signs and the nursing notes.   HISTORY  Chief Complaint Abdominal Pain   HPI Cheyenne Barnes is a 19 y.o. female who came in yesterday and after waiting 2-1/2 hours side she had waited too long. Comes back again today and tells me that she had gastroenteritis week or 2 ago she still having some diarrhea. Her belly pain had stopped but every once nausea have a pain that starts in her upper abdomen radiates down to the bottom and back up again and then goes away. The pain only lasts a minute or 2 sharp and crampy. Patient also has intermittent episodes of becoming nauseated and vomiting. This only lasts for a few minutes also and then she feels better. Patient's really unsure was going on. Patient reports she smokes marijuana and has for the last 2 years. I discussed hyperemesis due to marijuana smoking with her. I feel that the possibility of continuing gastroenteritis and/or this marijuana problem could explain her ongoing nausea and vomiting. She reports she has not increased the amount of marijuana she smokes and always gets the marijuana for the same supplier. Patient reports she also takes has ITP. Patient also reports that she's had a cough productive of small amounts of green or yellow phlegm and sometimes is clear phlegm. Occasionally she'll cough so much she vomits. Usually the vomiting comes on after some of the belly pain.  Past Medical History  Diagnosis Date  . Thrombocytopenia 02/19/2013  . 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 No pertinent past surgical history.  Current Outpatient Rx  Name  Route  Sig  Dispense  Refill  . MICROGESTIN FE 1/20  1-20 MG-MCG tablet   Oral   Take 1 tablet by mouth daily.      7     Dispense as written.   . ondansetron (ZOFRAN) 4 MG tablet   Oral   Take 1 tablet (4 mg total) by mouth daily as needed for nausea or vomiting.   20 tablet   1     Allergies Sulfa antibiotics  No family history on file.  Social History Social History  Substance Use Topics  . Smoking status: Never Smoker   . Smokeless tobacco: None  . Alcohol Use: No    Review of Systems Constitutional: No fever/chills Eyes: No visual changes. ENT: No sore throat. Cardiovascular: Denies chest pain. Respiratory: Denies shortness of breath. Gastrointestinal: See history of present illness.  No constipation. Genitourinary: Negative for dysuria. Musculoskeletal: Negative for back pain. Skin: Negative for rash. Neurological: Negative for headaches, focal weakness or numbness.  10-point ROS otherwise negative.  ____________________________________________   PHYSICAL EXAM:  VITAL SIGNS: ED Triage Vitals  Enc Vitals Group     BP 04/29/15 0753 118/61 mmHg     Pulse Rate 04/29/15 0753 78     Resp 04/29/15 0753 18     Temp 04/29/15 0753 97 F (36.1 C)     Temp Source 04/29/15 0753 Oral     SpO2 04/29/15 0753 97 %     Weight 04/29/15 0749 199 lb (90.266 kg)     Height 04/29/15 0749 5\' 3"  (1.6 m)     Head Cir --      Peak Flow --  Pain Score 04/29/15 0749 5     Pain Loc --      Pain Edu? --      Excl. in GC? --     Constitutional: Alert and oriented. Well appearing and in no acute distress. Eyes: Conjunctivae are normal. PERRL. EOMI. Head: Atraumatic. Nose: No congestion/rhinnorhea. Mouth/Throat: Mucous membranes are moist.  Oropharynx non-erythematous. Neck: No stridor Cardiovascular: Normal rate, regular rhythm. Grossly normal heart sounds.  Good peripheral circulation. Respiratory: Normal respiratory effort.  No retractions. Lungs CTAB. Gastrointestinal: Soft and nontender. No distention. No  abdominal bruits. No CVA tenderness. Musculoskeletal: No lower extremity tenderness nor edema.  No joint effusions. Neurologic:  Normal speech and language. No gross focal neurologic deficits are appreciated. No gait instability. Skin:  Skin is warm, dry and intact. No rash noted. Psychiatric: Mood and affect are normal. Speech and behavior are normal.  ____________________________________________   LABS (all labs ordered are listed, but only abnormal results are displayed)  Labs Reviewed  COMPREHENSIVE METABOLIC PANEL - Abnormal; Notable for the following:    ALT 12 (*)    All other components within normal limits  LIPASE, BLOOD - Abnormal; Notable for the following:    Lipase 20 (*)    All other components within normal limits  CBC   ____________________________________________  EKG EKG read and interpreted by me shows normal sinus rhythm at a rate of 67 normal axis no acute changes were seen ____________________________________________  RADIOLOGY  Chest x-ray and KUB showed no acute disease per radiology ____________________________________________   PROCEDURES    ____________________________________________   INITIAL IMPRESSION / ASSESSMENT AND PLAN / ED COURSE  Pertinent labs & imaging results that were available during my care of the patient were reviewed by me and considered in my medical decision making (see chart for details).   ____________________________________________   FINAL CLINICAL IMPRESSION(S) / ED DIAGNOSES  Final diagnoses:  Pain  Nausea and vomiting, vomiting of unspecified type      Arnaldo Natal, MD 04/29/15 [phone]

## 2015-04-29 NOTE — ED Notes (Signed)
N/v for three weeks

## 2015-04-29 NOTE — Discharge Instructions (Signed)
Abdominal Pain Many things can cause abdominal pain. Usually, abdominal pain is not caused by a disease and will improve without treatment. It can often be observed and treated at home. Your health care provider will do a physical exam and possibly order blood tests and X-rays to help determine the seriousness of your pain. However, in many cases, more time must pass before a clear cause of the pain can be found. Before that point, your health care provider may not know if you need more testing or further treatment. HOME CARE INSTRUCTIONS  Monitor your abdominal pain for any changes. The following actions may help to alleviate any discomfort you are experiencing:  Only take over-the-counter or prescription medicines as directed by your health care provider.  Do not take laxatives unless directed to do so by your health care provider.  Try a clear liquid diet (broth, tea, or water) as directed by your health care provider. Slowly move to a bland diet as tolerated. SEEK MEDICAL CARE IF:  You have unexplained abdominal pain.  You have abdominal pain associated with nausea or diarrhea.  You have pain when you urinate or have a bowel movement.  You experience abdominal pain that wakes you in the night.  You have abdominal pain that is worsened or improved by eating food.  You have abdominal pain that is worsened with eating fatty foods.  You have a fever. SEEK IMMEDIATE MEDICAL CARE IF:   Your pain does not go away within 2 hours.  You keep throwing up (vomiting).  Your pain is felt only in portions of the abdomen, such as the right side or the left lower portion of the abdomen.  You pass bloody or black tarry stools. MAKE SURE YOU:  Understand these instructions.   Will watch your condition.   Will get help right away if you are not doing well or get worse.  Document Released: 05/12/2005 Document Revised: 08/07/2013 Document Reviewed: 04/11/2013 [facility] Patient Information  2015 [place], [place]. This information is not intended to replace advice given to you by your health care provider. Make sure you discuss any questions you have with your health care provider.  Please follow-up with your doctor as planned. Right now your platelet count is okay. Her pregnancy test is normal in all the other lab work we did was okay I think it will be safe to take Pepto-Bismol, that should help with your diarrhea and vomiting as well. If that does not get you to improve in the next few days 2-3 days, I would have your doctor send specimens for stool culture and parasites. Please return for worse pain fever or if you're unable to keep down any fluids. Consider cutting back on the marijuana.

## 2015-05-01 ENCOUNTER — Encounter: Payer: Self-pay | Admitting: Hematology and Oncology

## 2015-05-01 ENCOUNTER — Inpatient Hospital Stay: Payer: Self-pay | Attending: Hematology and Oncology | Admitting: Hematology and Oncology

## 2015-05-01 ENCOUNTER — Inpatient Hospital Stay: Payer: Self-pay

## 2015-05-01 VITALS — BP 113/74 | HR 70 | Temp 99.4°F | Resp 18 | Ht 63.0 in | Wt 207.2 lb

## 2015-05-01 DIAGNOSIS — R109 Unspecified abdominal pain: Secondary | ICD-10-CM | POA: Insufficient documentation

## 2015-05-01 DIAGNOSIS — Z79899 Other long term (current) drug therapy: Secondary | ICD-10-CM | POA: Insufficient documentation

## 2015-05-01 DIAGNOSIS — D693 Immune thrombocytopenic purpura: Secondary | ICD-10-CM

## 2015-05-01 DIAGNOSIS — R110 Nausea: Secondary | ICD-10-CM | POA: Insufficient documentation

## 2015-05-01 LAB — COMPREHENSIVE METABOLIC PANEL
ALT: 13 U/L — ABNORMAL LOW (ref 14–54)
AST: 17 U/L (ref 15–41)
Albumin: 4.1 g/dL (ref 3.5–5.0)
Alkaline Phosphatase: 67 U/L (ref 38–126)
Anion gap: 5 (ref 5–15)
BUN: 12 mg/dL (ref 6–20)
CO2: 24 mmol/L (ref 22–32)
Calcium: 8.8 mg/dL — ABNORMAL LOW (ref 8.9–10.3)
Chloride: 105 mmol/L (ref 101–111)
Creatinine, Ser: 0.64 mg/dL (ref 0.44–1.00)
GFR calc Af Amer: >60 mL/min (ref >60)
GFR calc non Af Amer: >60 mL/min (ref >60)
Glucose, Bld: 89 mg/dL (ref 65–99)
Potassium: 4.7 mmol/L (ref 3.5–5.1)
Sodium: 134 mmol/L — ABNORMAL LOW (ref 135–145)
Total Bilirubin: 0.9 mg/dL (ref 0.3–1.2)
Total Protein: 7.9 g/dL (ref 6.5–8.1)

## 2015-05-01 LAB — CBC WITH DIFFERENTIAL/PLATELET
Basophils Absolute: 0 10*3/uL (ref 0–0.1)
Basophils Relative: 0 %
Eosinophils Absolute: 0 10*3/uL (ref 0–0.7)
Eosinophils Relative: 1 %
HCT: 40.6 % (ref 35.0–47.0)
Hemoglobin: 14 g/dL (ref 12.0–16.0)
Lymphocytes Relative: 29 %
Lymphs Abs: 1.9 10*3/uL (ref 1.0–3.6)
MCH: 32.1 pg (ref 26.0–34.0)
MCHC: 34.4 g/dL (ref 32.0–36.0)
MCV: 93.3 fL (ref 80.0–100.0)
Monocytes Absolute: 0.4 10*3/uL (ref 0.2–0.9)
Monocytes Relative: 7 %
Neutro Abs: 4.1 10*3/uL (ref 1.4–6.5)
Neutrophils Relative %: 63 %
Platelets: 235 10*3/uL (ref 150–440)
RBC: 4.35 MIL/uL (ref 3.80–5.20)
RDW: 13 % (ref 11.5–14.5)
WBC: 6.5 10*3/uL (ref 3.6–11.0)

## 2015-05-01 NOTE — Progress Notes (Signed)
Patient is here for follow-up of thrombocytopenia. Patient states that she has been having abdominal pain for off and on for the past couple of months. She has also been having nauseated. She states that she went to the ER on Tuesday because the abdominal pain was so bad. She states that they did several x-rays and and Echo and could not find anything wrong. They told her that it might be a stomach bug. Patient started thinking and believes that this may be her birth control medication. She has an appointment at the health department this afternoon to discuss this.

## 2015-05-01 NOTE — Progress Notes (Signed)
[facility] day:  05/01/2015  Chief Complaint: Cheyenne Barnes is a 19 y.o. female with a history of immune mediated thrombocytopenic purpura (ITP) for 6 month assessment.  HPI: The patient was last seen in the medical oncology clinic on 10/31/2014.  At that time, she was seen for initial assessment by me. She had ITP dating back to 02/2013. Initially, she was treated with steroids.  With taper, her platelets fell. She received Rituxan, completing on 05/31/2013. Platelets have been normal since that time.  At last visit, she described a 2 month history of unexplained syncope. Platelet count was normal at 233,000.  It was recommended that she follow-up with her primary care physician, Health Department or emergency room  During the interim, she is had a few month history of abdominal pain. She was seen in the emergency room at [facility] on 04/29/2015.  CBC and CMP were unremarkable. Glucose was 88. Chest x-ray revealed no acute cardiopulmonary issues. KUB was normal.   She notes episodes of becoming very nauseous.  Multiple pregnancy tests have been negative. She notes being on new birth control pills since the symptoms started.  Symptoms occur when she misses a pill and then takes 2 at a time. She also notes that her symptoms may improve after eating a cracker and drinking some Sprite (? hypoglycemia).  Past Medical History  Diagnosis Date  . Thrombocytopenia 02/19/2013  . 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  No family history on file.  Social History:  reports that she has never smoked. She does not have any smokeless tobacco history on file. She reports that she uses illicit drugs (Marijuana). She reports that she does not drink alcohol.  The patient is alone today.  Allergies:  Allergies  Allergen Reactions  . Sulfa Antibiotics Swelling and Rash    Current  Medications: Current Outpatient Prescriptions  Medication Sig Dispense Refill  . ondansetron (ZOFRAN) 4 MG tablet Take 1 tablet (4 mg total) by mouth daily as needed for nausea or vomiting. 20 tablet 1  . MICROGESTIN FE 1/20 1-20 MG-MCG tablet Take 1 tablet by mouth daily.  7   No current facility-administered medications for this visit.    Review of Systems:  GENERAL:  Feels pretty good.  Active.  No fevers, sweats or weight loss. PERFORMANCE STATUS (ECOG):  0 HEENT:  No visual changes, runny nose, sore throat, mouth sores or tenderness. Lungs: No shortness of breath or cough.  No hemoptysis. Cardiac:  No chest pain, palpitations, orthopnea, or PND. GI:  Nausea and some vomiting (intermittent).  No diarrhea, constipation, melena or hematochezia. GU:  No urgency, frequency, dysuria, or hematuria. Musculoskeletal:  No back pain.  No joint pain.  No muscle tenderness. Extremities:  No pain or swelling. Skin:  No rashes or skin changes. Neuro:  No headache, numbness or weakness, balance or coordination issues. Endocrine:  No diabetes, thyroid issues, hot flashes or night sweats. Psych:  No mood changes, depression or anxiety. Pain:  No focal pain. Review of systems:  All other systems reviewed and found to be negative.  Physical Exam: Blood pressure 113/74, pulse 70, temperature 99.4 F (37.4 C), temperature source Tympanic, resp. rate 18, height '5\' 3"'  (1.6 m), weight 207 lb 3.7 oz (94 kg), last menstrual period 04/11/2015. GENERAL:  Well developed, well nourished, sitting comfortably in the exam room in no acute distress. MENTAL STATUS:  Alert  and oriented to person, place and time. HEAD:  Long dark hair in pony tail.  Normocephalic, atraumatic, face symmetric, no Cushingoid features. EYES:  Green eyes.  Pupils equal round and reactive to light and accomodation.  No conjunctivitis or scleral icterus. ENT:  Oropharynx clear without lesion.  Tongue normal. Mucous membranes moist.   RESPIRATORY:  Clear to auscultation without rales, wheezes or rhonchi. CARDIOVASCULAR:  Regular rate and rhythm without murmur, rub or gallop. ABDOMEN:  Soft, non-tender, with active bowel sounds, and no hepatosplenomegaly.  No masses. SKIN:  No rashes, ulcers or lesions.  Few small bruises. EXTREMITIES: No edema, no skin discoloration or tenderness.  No palpable cords. LYMPH NODES: No palpable cervical, supraclavicular, axillary or inguinal adenopathy  NEUROLOGICAL: Unremarkable. PSYCH:  Appropriate.  Appointment on 05/01/2015  Component Date Value Ref Range Status  . WBC 05/01/2015 6.5  3.6 - 11.0 K/uL Final  . RBC 05/01/2015 4.35  3.80 - 5.20 MIL/uL Final  . Hemoglobin 05/01/2015 14.0  12.0 - 16.0 g/dL Final  . HCT 05/01/2015 40.6  35.0 - 47.0 % Final  . MCV 05/01/2015 93.3  80.0 - 100.0 fL Final  . MCH 05/01/2015 32.1  26.0 - 34.0 pg Final  . MCHC 05/01/2015 34.4  32.0 - 36.0 g/dL Final  . RDW 05/01/2015 13.0  11.5 - 14.5 % Final  . Platelets 05/01/2015 235  150 - 440 K/uL Final  . Neutrophils Relative % 05/01/2015 63   Final  . Neutro Abs 05/01/2015 4.1  1.4 - 6.5 K/uL Final  . Lymphocytes Relative 05/01/2015 29   Final  . Lymphs Abs 05/01/2015 1.9  1.0 - 3.6 K/uL Final  . Monocytes Relative 05/01/2015 7   Final  . Monocytes Absolute 05/01/2015 0.4  0.2 - 0.9 K/uL Final  . Eosinophils Relative 05/01/2015 1   Final  . Eosinophils Absolute 05/01/2015 0.0  0 - 0.7 K/uL Final  . Basophils Relative 05/01/2015 0   Final  . Basophils Absolute 05/01/2015 0.0  0 - 0.1 K/uL Final  . Sodium 05/01/2015 134* 135 - 145 mmol/L Final  . Potassium 05/01/2015 4.7  3.5 - 5.1 mmol/L Final  . Chloride 05/01/2015 105  101 - 111 mmol/L Final  . CO2 05/01/2015 24  22 - 32 mmol/L Final  . Glucose, Bld 05/01/2015 89  65 - 99 mg/dL Final  . BUN 05/01/2015 12  6 - 20 mg/dL Final  . Creatinine, Ser 05/01/2015 0.64  0.44 - 1.00 mg/dL Final  . Calcium 05/01/2015 8.8* 8.9 - 10.3 mg/dL Final  . Total  Protein 05/01/2015 7.9  6.5 - 8.1 g/dL Final  . Albumin 05/01/2015 4.1  3.5 - 5.0 g/dL Final  . AST 05/01/2015 17  15 - 41 U/L Final  . ALT 05/01/2015 13* 14 - 54 U/L Final  . Alkaline Phosphatase 05/01/2015 67  38 - 126 U/L Final  . Total Bilirubin 05/01/2015 0.9  0.3 - 1.2 mg/dL Final  . GFR calc non Af Amer 05/01/2015 >60  >60 mL/min Final  . GFR calc Af Amer 05/01/2015 >60  >60 mL/min Final   Comment: (NOTE) The eGFR has been calculated using the CKD EPI equation. This calculation has not been validated in all clinical situations. eGFR's persistently <60 mL/min signify possible Chronic Kidney Disease.   . Anion gap 05/01/2015 5  5 - 15 Final  Admission on 04/29/2015, Discharged on 04/29/2015  Component Date Value Ref Range Status  . WBC 04/29/2015 5.7  3.6 - 11.0 K/uL Final  .  RBC 04/29/2015 4.34  3.80 - 5.20 MIL/uL Final  . Hemoglobin 04/29/2015 13.6  12.0 - 16.0 g/dL Final  . HCT 04/29/2015 41.5  35.0 - 47.0 % Final  . MCV 04/29/2015 95.4  80.0 - 100.0 fL Final  . MCH 04/29/2015 31.2  26.0 - 34.0 pg Final  . MCHC 04/29/2015 32.7  32.0 - 36.0 g/dL Final  . RDW 04/29/2015 13.1  11.5 - 14.5 % Final  . Platelets 04/29/2015 229  150 - 440 K/uL Final  . Sodium 04/29/2015 138  135 - 145 mmol/L Final  . Potassium 04/29/2015 4.5  3.5 - 5.1 mmol/L Final  . Chloride 04/29/2015 107  101 - 111 mmol/L Final  . CO2 04/29/2015 26  22 - 32 mmol/L Final  . Glucose, Bld 04/29/2015 88  65 - 99 mg/dL Final  . BUN 04/29/2015 10  6 - 20 mg/dL Final  . Creatinine, Ser 04/29/2015 0.64  0.44 - 1.00 mg/dL Final  . Calcium 04/29/2015 9.3  8.9 - 10.3 mg/dL Final  . Total Protein 04/29/2015 7.7  6.5 - 8.1 g/dL Final  . Albumin 04/29/2015 4.0  3.5 - 5.0 g/dL Final  . AST 04/29/2015 21  15 - 41 U/L Final  . ALT 04/29/2015 12* 14 - 54 U/L Final  . Alkaline Phosphatase 04/29/2015 71  38 - 126 U/L Final  . Total Bilirubin 04/29/2015 0.6  0.3 - 1.2 mg/dL Final  . GFR calc non Af Amer 04/29/2015 >60  >60  mL/min Final  . GFR calc Af Amer 04/29/2015 >60  >60 mL/min Final   Comment: (NOTE) The eGFR has been calculated using the CKD EPI equation. This calculation has not been validated in all clinical situations. eGFR's persistently <60 mL/min signify possible Chronic Kidney Disease.   . Anion gap 04/29/2015 5  5 - 15 Final  . Lipase 04/29/2015 20* 22 - 51 U/L Final    Assessment:  JULETTA BERHE is a 19 y.o. female with a history of ITP dating back to 02/19/2013.  She was treated with steroids, but with prednisone taper, her platelet count fell.  Prednsione doses < 30 mg a day caused her platelets to fall to < 50,000.  She received Rituxan from 05/04/2013 until 05/31/2013. Her platelet count has been normal since 05/31/2013.  She has a a recent history of abdominal discomfort and nausea.  Symptoms occur after taking 2 birth control pills at a time and may improve after eating (? hypoglycemia).  She denies any bruising or bleeding.  Exam reveals no adenopathy or hepatosplenomegaly.  Platelet count is 229,000.  Plan: 1. Labs today:  CBC with diff, CMP. 2. Patient to follow-up as scheduled with the health department today to assess abdominal symptoms and discuss birth control. 3. RTC prn any bruising or bleeding concerns or drop in platelet count.   Cheyenne Asal, MD  05/01/2015, 11:44 AM

## 2015-05-29 ENCOUNTER — Emergency Department
Admission: EM | Admit: 2015-05-29 | Discharge: 2015-05-29 | Disposition: A | Discharge status: Home / Self Care | Payer: Self-pay | Attending: Emergency Medicine | Admitting: Emergency Medicine

## 2015-05-29 ENCOUNTER — Encounter: Payer: Self-pay | Admitting: Emergency Medicine

## 2015-05-29 ENCOUNTER — Emergency Department: Payer: Self-pay

## 2015-05-29 DIAGNOSIS — Z793 Long term (current) use of hormonal contraceptives: Secondary | ICD-10-CM | POA: Insufficient documentation

## 2015-05-29 DIAGNOSIS — Z3202 Encounter for pregnancy test, result negative: Secondary | ICD-10-CM | POA: Insufficient documentation

## 2015-05-29 DIAGNOSIS — J209 Acute bronchitis, unspecified: Secondary | ICD-10-CM | POA: Insufficient documentation

## 2015-05-29 LAB — POCT PREGNANCY, URINE: Preg Test, Ur: NEGATIVE

## 2015-05-29 MED ORDER — AZITHROMYCIN 250 MG PO TABS: ORAL_TABLET | ORAL | Status: DC

## 2015-05-29 MED ORDER — BENZONATATE 100 MG PO CAPS: ORAL_CAPSULE | ORAL | Status: DC

## 2015-05-29 NOTE — ED Notes (Signed)
States she developed a cough about 2 weeks ago..now has a burning sensation to throat for the past 4-5 days   No fever

## 2015-05-29 NOTE — ED Provider Notes (Signed)
[facility] Emergency Department Provider Note  ____________________________________________  Time seen: Approximately 2:18 PM  I have reviewed the triage vital signs and the nursing notes.   HISTORY  Chief Complaint Sore Throat  HPI Cheyenne Barnes is a 19 y.o. female chief complaint of cough for 2 weeks. Patient states that she now has burning sensation in her throat for the last 4 or 5 days and had a fever initially but no fever recently. Patient states she has not been taking any over-the-counter medication.Patient states that she had bronchitis couple months ago. She denies smoking but admits to "weed". She denies any pain at present.   Past Medical History  Diagnosis Date  . Thrombocytopenia (HCC) 02/19/2013  . 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 No pertinent past surgical history.  Current Outpatient Rx  Name  Route  Sig  Dispense  Refill  . azithromycin (ZITHROMAX Z-PAK) 250 MG tablet      Take 2 tablets (500 mg) on  Day 1,  followed by 1 tablet (250 mg) once daily on Days 2 through 5.   6 each   0   . benzonatate (TESSALON PERLES) 100 MG capsule      Take 1-2 q 8 hours prn cough   30 capsule   0   . MICROGESTIN FE 1/20 1-20 MG-MCG tablet   Oral   Take 1 tablet by mouth daily.      7     Dispense as written.   . ondansetron (ZOFRAN) 4 MG tablet   Oral   Take 1 tablet (4 mg total) by mouth daily as needed for nausea or vomiting.   20 tablet   1     Allergies Sulfa antibiotics  No family history on file.  Social History Social History  Substance Use Topics  . Smoking status: Never Smoker   . Smokeless tobacco: None  . Alcohol Use: No    Review of Systems Constitutional: Positive fever/negative chills ENT: Positive sore throat. Cardiovascular: Denies chest  pain. Respiratory: Denies shortness of breath. Gastrointestinal: No abdominal pain.  No nausea, no vomiting.   Genitourinary: Negative for dysuria. Musculoskeletal: Negative for back pain. Skin: Negative for rash. Neurological: Negative for headaches, focal weakness or numbness.  10-point ROS otherwise negative.  ____________________________________________   PHYSICAL EXAM:  VITAL SIGNS: ED Triage Vitals  Enc Vitals Group     BP 05/29/15 1358 120/69 mmHg     Pulse Rate 05/29/15 1358 96     Resp --      Temp 05/29/15 1358 98.6 F (37 C)     Temp Source 05/29/15 1358 Oral     SpO2 05/29/15 1358 97 %     Weight 05/29/15 1358 199 lb (90.266 kg)     Height 05/29/15 1358 5\' 3"  (1.6 m)     Head Cir --      Peak Flow --      Pain Score 05/29/15 1359 0     Pain Loc --      Pain Edu? --      Excl. in GC? --     Constitutional: Alert and oriented. Well appearing and in no acute distress. Eyes: Conjunctivae are normal. PERRL. EOMI. Head: Atraumatic. Nose: Positive congestion/negative rhinnorhea. Mouth/Throat: Mucous membranes are moist.  Oropharynx non-erythematous. Posterior drainage was seen. Neck: No stridor.  Supple Hematological/Lymphatic/Immunilogical: No  cervical lymphadenopathy. Cardiovascular: Normal rate, regular rhythm. Grossly normal heart sounds.  Good peripheral circulation. Respiratory: Normal respiratory effort.  No retractions. Lungs CTAB bilaterally with coarse cough heard. Gastrointestinal: Soft and nontender. No distention.  Musculoskeletal: No lower extremity tenderness nor edema.  No joint effusions. Neurologic:  Normal speech and language. No gross focal neurologic deficits are appreciated. No gait instability. Skin:  Skin is warm, dry and intact. No rash noted. Psychiatric: Mood and affect are normal. Speech and behavior are normal.  ____________________________________________   LABS (all labs ordered are listed, but only abnormal results are  displayed)  Labs Reviewed  CULTURE, GROUP A STREP ([facility] ONLY)  POC URINE PREG, ED  POCT PREGNANCY, URINE     RADIOLOGY  Negative per radiologist and reviewed by me I, Tommi Rumps, personally viewed and evaluated these images (plain radiographs) as part of my medical decision making.  ____________________________________________   PROCEDURES  Procedure(s) performed: None  Critical Care performed: No  ____________________________________________   INITIAL IMPRESSION / ASSESSMENT AND PLAN / ED COURSE  Pertinent labs & imaging results that were available during my care of the patient were reviewed by me and considered in my medical decision making (see chart for details)  Patient was placed on Zithromax and Tessalon as needed for coughing. Patient is given a note for work for the next 2 days. She is to increase fluids and Tylenol as needed for fever.   ____________________________________________   FINAL CLINICAL IMPRESSION(S) / ED DIAGNOSES  Final diagnoses:  Acute bronchitis, unspecified organism      Tommi Rumps, PA-C 05/29/15 1552  Minna Antis, MD 05/30/15 [phone]

## 2015-05-29 NOTE — Discharge Instructions (Signed)

## 2015-05-29 NOTE — ED Notes (Signed)
Sore throat for about 1 week  Describes as burning pain cough for about 2 weeks

## 2015-05-31 LAB — CULTURE, GROUP A STREP (THRC)

## 2015-07-21 ENCOUNTER — Emergency Department
Admission: EM | Admit: 2015-07-21 | Discharge: 2015-07-21 | Disposition: A | Discharge status: Home / Self Care | Payer: Self-pay | Attending: Emergency Medicine | Admitting: Emergency Medicine

## 2015-07-21 ENCOUNTER — Emergency Department: Payer: No Typology Code available for payment source

## 2015-07-21 ENCOUNTER — Emergency Department: Payer: Self-pay

## 2015-07-21 ENCOUNTER — Encounter: Payer: Self-pay | Admitting: Emergency Medicine

## 2015-07-21 DIAGNOSIS — S39012A Strain of muscle, fascia and tendon of lower back, initial encounter: Secondary | ICD-10-CM

## 2015-07-21 DIAGNOSIS — Y92410 Unspecified street and highway as the place of occurrence of the external cause: Secondary | ICD-10-CM | POA: Insufficient documentation

## 2015-07-21 DIAGNOSIS — Y998 Other external cause status: Secondary | ICD-10-CM | POA: Insufficient documentation

## 2015-07-21 DIAGNOSIS — R51 Headache: Secondary | ICD-10-CM

## 2015-07-21 DIAGNOSIS — Z3202 Encounter for pregnancy test, result negative: Secondary | ICD-10-CM | POA: Insufficient documentation

## 2015-07-21 DIAGNOSIS — Y9389 Activity, other specified: Secondary | ICD-10-CM | POA: Insufficient documentation

## 2015-07-21 DIAGNOSIS — R519 Headache, unspecified: Secondary | ICD-10-CM

## 2015-07-21 DIAGNOSIS — S0990XA Unspecified injury of head, initial encounter: Secondary | ICD-10-CM | POA: Insufficient documentation

## 2015-07-21 LAB — URINALYSIS COMPLETE WITH MICROSCOPIC (ARMC ONLY)
BILIRUBIN URINE: NEGATIVE
Glucose, UA: NEGATIVE mg/dL
Leukocytes, UA: NEGATIVE
Nitrite: NEGATIVE
PH: 5 (ref 5.0–8.0)
PROTEIN: NEGATIVE mg/dL
Specific Gravity, Urine: 1.028 (ref 1.005–1.030)

## 2015-07-21 LAB — CBC WITH DIFFERENTIAL/PLATELET
BASOS ABS: 0.1 10*3/uL (ref 0–0.1)
Basophils Relative: 1 %
EOS ABS: 0 10*3/uL (ref 0–0.7)
EOS PCT: 1 %
HCT: 45.3 % (ref 35.0–47.0)
Hemoglobin: 14.7 g/dL (ref 12.0–16.0)
LYMPHS ABS: 2.4 10*3/uL (ref 1.0–3.6)
LYMPHS PCT: 33 %
MCH: 31.4 pg (ref 26.0–34.0)
MCHC: 32.6 g/dL (ref 32.0–36.0)
MCV: 96.2 fL (ref 80.0–100.0)
MONO ABS: 0.4 10*3/uL (ref 0.2–0.9)
Monocytes Relative: 6 %
Neutro Abs: 4.5 10*3/uL (ref 1.4–6.5)
Neutrophils Relative %: 59 %
PLATELETS: 232 10*3/uL (ref 150–440)
RBC: 4.7 MIL/uL (ref 3.80–5.20)
RDW: 13 % (ref 11.5–14.5)
WBC: 7.5 10*3/uL (ref 3.6–11.0)

## 2015-07-21 LAB — BASIC METABOLIC PANEL
Anion gap: 9 (ref 5–15)
BUN: 9 mg/dL (ref 6–20)
CO2: 23 mmol/L (ref 22–32)
Calcium: 9.6 mg/dL (ref 8.9–10.3)
Chloride: 108 mmol/L (ref 101–111)
Creatinine, Ser: 0.61 mg/dL (ref 0.44–1.00)
GFR calc Af Amer: >60 mL/min (ref >60)
GLUCOSE: 85 mg/dL (ref 65–99)
POTASSIUM: 3.7 mmol/L (ref 3.5–5.1)
SODIUM: 140 mmol/L (ref 135–145)

## 2015-07-21 LAB — POCT PREGNANCY, URINE: Preg Test, Ur: NEGATIVE

## 2015-07-21 LAB — PREGNANCY, URINE: Preg Test, Ur: NEGATIVE

## 2015-07-21 MED ORDER — DIAZEPAM 5 MG PO TABS: 5.0000 mg | ORAL_TABLET | Freq: Three times a day (TID) | ORAL | Status: DC | PRN

## 2015-07-21 MED ORDER — IBUPROFEN 800 MG PO TABS: 800.0000 mg | ORAL_TABLET | Freq: Three times a day (TID) | ORAL | Status: DC | PRN

## 2015-07-21 MED ORDER — OXYCODONE-ACETAMINOPHEN 5-325 MG PO TABS
2.0000 | ORAL_TABLET | Freq: Once | ORAL | Status: AC
  Administered 2015-07-21: 2 via ORAL
  Filled 2015-07-21: qty 2
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
## 2015-07-21 NOTE — ED Notes (Signed)
Pt also reports having nausea and vomiting for the last day as well.

## 2015-08-30 ENCOUNTER — Emergency Department: Payer: Self-pay

## 2015-08-30 ENCOUNTER — Emergency Department
Admission: EM | Admit: 2015-08-30 | Discharge: 2015-08-30 | Disposition: A | Discharge status: Home / Self Care | Payer: Self-pay | Attending: Emergency Medicine | Admitting: Emergency Medicine

## 2015-08-30 ENCOUNTER — Encounter: Payer: Self-pay | Admitting: Emergency Medicine

## 2015-08-30 DIAGNOSIS — R319 Hematuria, unspecified: Secondary | ICD-10-CM

## 2015-08-30 DIAGNOSIS — Z792 Long term (current) use of antibiotics: Secondary | ICD-10-CM | POA: Insufficient documentation

## 2015-08-30 DIAGNOSIS — Z79899 Other long term (current) drug therapy: Secondary | ICD-10-CM | POA: Insufficient documentation

## 2015-08-30 DIAGNOSIS — R55 Syncope and collapse: Secondary | ICD-10-CM | POA: Insufficient documentation

## 2015-08-30 DIAGNOSIS — N390 Urinary tract infection, site not specified: Secondary | ICD-10-CM | POA: Insufficient documentation

## 2015-08-30 DIAGNOSIS — Z3202 Encounter for pregnancy test, result negative: Secondary | ICD-10-CM | POA: Insufficient documentation

## 2015-08-30 LAB — RAPID INFLUENZA A&B ANTIGENS
Influenza A (ARMC): NEGATIVE
Influenza B (ARMC): NEGATIVE

## 2015-08-30 LAB — BASIC METABOLIC PANEL
ANION GAP: 8 (ref 5–15)
BUN: 11 mg/dL (ref 6–20)
CHLORIDE: 106 mmol/L (ref 101–111)
CO2: 25 mmol/L (ref 22–32)
CREATININE: 0.72 mg/dL (ref 0.44–1.00)
Calcium: 9 mg/dL (ref 8.9–10.3)
GFR calc non Af Amer: >60 mL/min (ref >60)
Glucose, Bld: 110 mg/dL — ABNORMAL HIGH (ref 65–99)
Potassium: 4.1 mmol/L (ref 3.5–5.1)
SODIUM: 139 mmol/L (ref 135–145)

## 2015-08-30 LAB — URINALYSIS COMPLETE WITH MICROSCOPIC (ARMC ONLY)
BILIRUBIN URINE: NEGATIVE
Glucose, UA: NEGATIVE mg/dL
HGB URINE DIPSTICK: NEGATIVE
KETONES UR: NEGATIVE mg/dL
Nitrite: NEGATIVE
PH: 5 (ref 5.0–8.0)
PROTEIN: 100 mg/dL — AB
Specific Gravity, Urine: 1.027 (ref 1.005–1.030)

## 2015-08-30 LAB — TROPONIN I: Troponin I: <0.03 ng/mL (ref <0.031)

## 2015-08-30 LAB — CBC
HCT: 43.4 % (ref 35.0–47.0)
HEMOGLOBIN: 14.3 g/dL (ref 12.0–16.0)
MCH: 31.6 pg (ref 26.0–34.0)
MCHC: 32.9 g/dL (ref 32.0–36.0)
MCV: 96.1 fL (ref 80.0–100.0)
PLATELETS: 247 10*3/uL (ref 150–440)
RBC: 4.52 MIL/uL (ref 3.80–5.20)
RDW: 13.5 % (ref 11.5–14.5)
WBC: 11.4 10*3/uL — AB (ref 3.6–11.0)

## 2015-08-30 LAB — PREGNANCY, URINE: Preg Test, Ur: NEGATIVE

## 2015-08-30 LAB — POCT PREGNANCY, URINE: Preg Test, Ur: NEGATIVE

## 2015-08-30 MED ORDER — IPRATROPIUM-ALBUTEROL 0.5-2.5 (3) MG/3ML IN SOLN
3.0000 mL | Freq: Once | RESPIRATORY_TRACT | Status: AC
  Administered 2015-08-30: 3 mL via RESPIRATORY_TRACT
  Filled 2015-08-30: qty 3

## 2015-08-30 MED ORDER — CEPHALEXIN 500 MG PO CAPS
500.0000 mg | ORAL_CAPSULE | Freq: Once | ORAL | Status: AC
  Administered 2015-08-30: 500 mg via ORAL

## 2015-08-30 MED ORDER — CEPHALEXIN 500 MG PO CAPS
500.0000 mg | ORAL_CAPSULE | Freq: Four times a day (QID) | ORAL | Status: AC
Start: 2015-08-30 — End: 2015-09-09

## 2015-08-30 MED ORDER — CEPHALEXIN 500 MG PO CAPS
ORAL_CAPSULE | ORAL | Status: AC
  Administered 2015-08-30: 500 mg via ORAL
  Filled 2015-08-30: qty 1

## 2015-08-30 NOTE — ED Provider Notes (Signed)
[facility] Emergency Department Provider Note  ____________________________________________  Time seen: Approximately 9:19 PM  I have reviewed the triage vital signs and the nursing notes.   HISTORY  Chief Complaint Loss of Consciousness and URI    HPI Cheyenne Barnes is a 20 y.o. female patient reports she got sick about a week ago with coughing sneezing and began aching all over she's been feeling very bad for about a week with the achiness the coughing she went to take a hot shower today and in the hot shower she began to feel even weaker and dizzy lightheaded like she was going to pass out and then she got out of the shower and did pass out. She has not been running a fever that she knows about. She complains of aches and pains in the arms and legs. No nausea no vomiting no abdominal pain. She says she feels like her chest is tight like she is going to be wheezing.   Past Medical History  Diagnosis Date  . Thrombocytopenia (HCC) 02/19/2013  . 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  Current Outpatient Rx  Name  Route  Sig  Dispense  Refill  . acetaminophen (TYLENOL) 325 MG tablet   Oral   Take 325 mg by mouth every 6 (six) hours as needed for moderate pain.         [name] azithromycin (ZITHROMAX Z-PAK) 250 MG tablet      Take 2 tablets (500 mg) on  Day 1,  followed by 1 tablet (250 mg) once daily on Days 2 through 5. Patient not taking: Reported on 07/21/2015   6 each   0   . benzonatate (TESSALON PERLES) 100 MG capsule      Take 1-2 q 8 hours prn cough Patient not taking: Reported on 07/21/2015   30 capsule   0   . cephALEXin (KEFLEX) 500 MG capsule   Oral   Take 1 capsule (500 mg total) by mouth 4 (four) times daily.   40 capsule   0   . diazepam (VALIUM) 5 MG tablet    Oral   Take 1 tablet (5 mg total) by mouth every 8 (eight) hours as needed for muscle spasms.   20 tablet   0   . ibuprofen (ADVIL,MOTRIN) 800 MG tablet   Oral   Take 1 tablet (800 mg total) by mouth every 8 (eight) hours as needed.   30 tablet   0   . Norgestimate-Ethinyl Estradiol Triphasic (TRINESSA, 28,) 0.18/0.215/0.25 MG-35 MCG tablet   Oral   Take 1 tablet by mouth daily.         . ondansetron (ZOFRAN) 4 MG tablet   Oral   Take 1 tablet (4 mg total) by mouth daily as needed for nausea or vomiting. Patient not taking: Reported on 07/21/2015   20 tablet   1     Allergies Codeine and Sulfa antibiotics  No family history on file.  Social History Social History  Substance Use Topics  . Smoking status: Never Smoker   . Smokeless tobacco: Never Used  . Alcohol Use: No    Review of Systems Constitutional: No fever/chills Eyes: No visual changes. ENT: No sore throat. Cardiovascular: Denies chest pain.  Respiratory: Denies shortness of breath. Gastrointestinal: No abdominal pain.  No nausea, no vomiting.  No  diarrhea.  No constipation. Genitourinary: Negative for dysuria. Musculoskeletal: Negative for back pain. Skin: Negative for rash. Neurological: Negative for headaches, focal weakness or numbness.  10-point ROS otherwise negative.  ____________________________________________   PHYSICAL EXAM:  VITAL SIGNS: ED Triage Vitals  Enc Vitals Group     BP 08/30/15 1910 122/94 mmHg     Pulse Rate 08/30/15 1910 74     Resp 08/30/15 1910 16     Temp 08/30/15 1910 98.9 F (37.2 C)     Temp Source 08/30/15 1910 Oral     SpO2 08/30/15 1910 100 %     Weight 08/30/15 1910 199 lb (90.266 kg)     Height 08/30/15 1910 5\' 3"  (1.6 m)     Head Cir --      Peak Flow --      Pain Score 08/30/15 1910 7     Pain Loc --      Pain Edu? --      Excl. in GC? --     Constitutional: Alert and oriented. Looks tired Eyes: Conjunctivae are normal. PERRL. EOMI. Head:  Atraumatic. Nose: No congestion/rhinnorhea. Mouth/Throat: Mucous membranes are moist.  Oropharynx non-erythematous. Neck: No stridor. Cardiovascular: Normal rate, regular rhythm. Grossly normal heart sounds.  Good peripheral circulation. Respiratory: Normal respiratory effort.  No retractions. Lungs CTAB. Gastrointestinal: Soft and nontender. No distention. No abdominal bruits. No CVA tenderness. Musculoskeletal: No lower extremity tenderness nor edema.  No joint effusions. Neurologic:  Normal speech and language. No gross focal neurologic deficits are appreciated. No gait instability. Skin:  Skin is warm, dry and intact. No rash noted. Psychiatric: Mood and affect are normal. Speech and behavior are normal.  ____________________________________________   LABS (all labs ordered are listed, but only abnormal results are displayed)  Labs Reviewed  BASIC METABOLIC PANEL - Abnormal; Notable for the following:    Glucose, Bld 110 (*)    All other components within normal limits  CBC - Abnormal; Notable for the following:    WBC 11.4 (*)    All other components within normal limits  URINALYSIS COMPLETEWITH MICROSCOPIC ([facility] ONLY) - Abnormal; Notable for the following:    Color, Urine YELLOW (*)    APPearance HAZY (*)    Protein, ur 100 (*)    Leukocytes, UA TRACE (*)    Bacteria, UA RARE (*)    Squamous Epithelial / LPF 6-30 (*)    All other components within normal limits  RAPID INFLUENZA A&B ANTIGENS ([facility] ONLY)  PREGNANCY, URINE  TROPONIN I  POCT PREGNANCY, URINE   ____________________________________________  EKG  EKG read and interpreted by me shows normal sinus rhythm at a rate of 76 normal axis no acute ST-T wave changes ____________________________________________  RADIOLOGY  Chest x-ray read by radiology as no acute disease ____________________________________________   PROCEDURES    ____________________________________________   INITIAL IMPRESSION /  ASSESSMENT AND PLAN / ED COURSE  Pertinent labs & imaging results that were available during my care of the patient were reviewed by me and considered in my medical decision making (see chart for details). She feels better on discharge ____________________________________________   FINAL CLINICAL IMPRESSION(S) / ED DIAGNOSES  Final diagnoses:  Syncope, unspecified syncope type  Urinary tract infection with hematuria, site unspecified      Arnaldo NatalPaul F Malinda, MD 08/30/15 2303

## 2015-08-30 NOTE — ED Notes (Signed)
Pt states has had uri symptoms with emesis for one week. Pt states has vomiting "a couple of times". Pt states tonight "passed out" in the shower prompting her to seek ed care. Pt with pwd skin, resps unlabored. Pt complains of generalized body aches.

## 2015-08-30 NOTE — ED Notes (Signed)
Pt discharged to home.  Family member driving.  Discharge instructions reviewed.  Verbalized understanding.  No questions or concerns at this time.  Teach back verified.  Pt in NAD.  No items left in ED.   

## 2016-03-31 NOTE — Progress Notes (Signed)
[place] Clinic day:  04/01/16  Chief Complaint: Cheyenne Barnes is a 20 y.o. female with a history of immune mediated thrombocytopenic purpura (ITP) who is seen for reassessment.  HPI: The patient was last seen in the medical oncology clinic on 05/01/2015.  At that time, she denied any bruising or bleeding.  Exam revealed no adenopathy or hepatosplenomegaly.  Platelet count was 229,000.  We discussed follow-up as needed.  The patient states that she has been feeling great but was concerned about new bruises. About 2 weeks ago she had 8 bruises. One was quite large on on the lateral aspect of her right thigh.  She had a few gum bleeds. She had no nosebleeds. Menses was a little heavier than normal.  She denies any new medications or herbal products.  She has had a little bit of a temperature with a wisdom tooth coming in.   Past Medical History:  Diagnosis Date  . 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 Thrombocytopenia ([place]) 02/19/2013    No past surgical history on file.  No family history on file.  Social History:  reports that she has never smoked. She has never used smokeless tobacco. She reports that she uses drugs, including Marijuana. She reports that she does not drink alcohol.  The patient is alone today.  Allergies:  Allergies  Allergen Reactions  . Codeine Nausea And Vomiting  . Sulfa Antibiotics Swelling and Rash    Current Medications: Current Outpatient Prescriptions  Medication Sig Dispense Refill  . UNABLE TO FIND Med Name: Stackers    . acetaminophen (TYLENOL) 325 MG tablet Take 325 mg by mouth every 6 (six) hours as needed for moderate pain.    [name] azithromycin (ZITHROMAX Z-PAK) 250 MG tablet Take 2 tablets (500 mg) on  Day 1,  followed by 1 tablet (250 mg) once daily on Days 2 through 5. (Patient not taking: Reported on 07/21/2015) 6 each 0  . benzonatate (TESSALON PERLES) 100 MG capsule Take 1-2 q 8 hours  prn cough (Patient not taking: Reported on 07/21/2015) 30 capsule 0  . diazepam (VALIUM) 5 MG tablet Take 1 tablet (5 mg total) by mouth every 8 (eight) hours as needed for muscle spasms. (Patient not taking: Reported on 04/01/2016) 20 tablet 0  . ibuprofen (ADVIL,MOTRIN) 800 MG tablet Take 1 tablet (800 mg total) by mouth every 8 (eight) hours as needed. (Patient not taking: Reported on 04/01/2016) 30 tablet 0  . Norgestimate-Ethinyl Estradiol Triphasic (TRINESSA, 28,) 0.18/0.215/0.25 MG-35 MCG tablet Take 1 tablet by mouth daily.    . ondansetron (ZOFRAN) 4 MG tablet Take 1 tablet (4 mg total) by mouth daily as needed for nausea or vomiting. (Patient not taking: Reported on 07/21/2015) 20 tablet 1   No current facility-administered medications for this visit.     Review of Systems:  GENERAL:  Feels great.  Active.  No fevers, sweats.  Weight down 18 pounds in past year. PERFORMANCE STATUS (ECOG):  0 HEENT:  Few gum bleeds.  No epistaxis.  Tooth coming in.  No visual changes, runny nose, sore throat, mouth sores or tenderness. Lungs: No shortness of breath or cough.  No hemoptysis. Cardiac:  No chest pain, palpitations, orthopnea, or PND. GI:  Nausea and some vomiting (intermittent).  No diarrhea, constipation, melena or hematochezia. GU:  Menses little heavier than normal.  No urgency, frequency, dysuria, or hematuria. Musculoskeletal:  No back pain.  No joint pain.  No muscle tenderness.  Extremities:  No pain or swelling. Skin:  Bruises, fading.  No rashes or skin changes. Neuro:  No headache, numbness or weakness, balance or coordination issues. Endocrine:  No diabetes, thyroid issues, hot flashes or night sweats. Psych:  No mood changes, depression or anxiety. Pain:  No focal pain. Review of systems:  All other systems reviewed and found to be negative.  Physical Exam: Blood pressure 104/70, pulse 70, temperature (!) 95.9 F (35.5 C), temperature source Tympanic, resp. rate 18, weight  189 lb 9.5 oz (86 kg), SpO2 97 %. GENERAL:  Well developed, well nourished, woman sitting comfortably in the exam room in no acute distress. MENTAL STATUS:  Alert and oriented to person, place and time. HEAD:  Long brown hairl.  Normocephalic, atraumatic, face symmetric, no Cushingoid features. EYES:  Green eyes.  Pupils equal round and reactive to light and accomodation.  No conjunctivitis or scleral icterus. ENT:  Oropharynx clear without lesion.  Tongue normal. Mucous membranes moist.  RESPIRATORY:  Clear to auscultation without rales, wheezes or rhonchi. CARDIOVASCULAR:  Regular rate and rhythm without murmur, rub or gallop. ABDOMEN:  Soft, non-tender, with active bowel sounds, and no hepatosplenomegaly.  No masses. SKIN:  No petechiae.  Small faded bruise on leg.  No rashes, ulcers or lesions.  Few small bruises. EXTREMITIES: No edema, no skin discoloration or tenderness.  No palpable cords. LYMPH NODES: No palpable cervical, supraclavicular, axillary or inguinal adenopathy  NEUROLOGICAL: Unremarkable. PSYCH:  Appropriate.   No visits with results within 3 Day(s) from this visit.  Latest known visit with results is:  Admission on 08/30/2015, Discharged on 08/30/2015  Component Date Value Ref Range Status  . Sodium 08/30/2015 139  135 - 145 mmol/L Final  . Potassium 08/30/2015 4.1  3.5 - 5.1 mmol/L Final  . Chloride 08/30/2015 106  101 - 111 mmol/L Final  . CO2 08/30/2015 25  22 - 32 mmol/L Final  . Glucose, Bld 08/30/2015 110* 65 - 99 mg/dL Final  . BUN 08/30/2015 11  6 - 20 mg/dL Final  . Creatinine, Ser 08/30/2015 0.72  0.44 - 1.00 mg/dL Final  . Calcium 08/30/2015 9.0  8.9 - 10.3 mg/dL Final  . GFR calc non Af Amer 08/30/2015 >60  >60 mL/min Final  . GFR calc Af Amer 08/30/2015 >60  >60 mL/min Final   Comment: (NOTE) The eGFR has been calculated using the CKD EPI equation. This calculation has not been validated in all clinical situations. eGFR's persistently <60 mL/min  signify possible Chronic Kidney Disease.   . Anion gap 08/30/2015 8  5 - 15 Final  . WBC 08/30/2015 11.4* 3.6 - 11.0 K/uL Final  . RBC 08/30/2015 4.52  3.80 - 5.20 MIL/uL Final  . Hemoglobin 08/30/2015 14.3  12.0 - 16.0 g/dL Final  . HCT 08/30/2015 43.4  35.0 - 47.0 % Final  . MCV 08/30/2015 96.1  80.0 - 100.0 fL Final  . MCH 08/30/2015 31.6  26.0 - 34.0 pg Final  . MCHC 08/30/2015 32.9  32.0 - 36.0 g/dL Final  . RDW 08/30/2015 13.5  11.5 - 14.5 % Final  . Platelets 08/30/2015 247  150 - 440 K/uL Final  . Color, Urine 08/30/2015 YELLOW* YELLOW Final  . APPearance 08/30/2015 HAZY* CLEAR Final  . Glucose, UA 08/30/2015 NEGATIVE  NEGATIVE mg/dL Final  . Bilirubin Urine 08/30/2015 NEGATIVE  NEGATIVE Final  . Ketones, ur 08/30/2015 NEGATIVE  NEGATIVE mg/dL Final  . Specific Gravity, Urine 08/30/2015 1.027  1.005 - 1.030 Final  .  Hgb urine dipstick 08/30/2015 NEGATIVE  NEGATIVE Final  . pH 08/30/2015 5.0  5.0 - 8.0 Final  . Protein, ur 08/30/2015 100* NEGATIVE mg/dL Final  . Nitrite 08/30/2015 NEGATIVE  NEGATIVE Final  . Leukocytes, UA 08/30/2015 TRACE* NEGATIVE Final  . RBC / HPF 08/30/2015 0-5  0 - 5 RBC/hpf Final  . WBC, UA 08/30/2015 6-30  0 - 5 WBC/hpf Final  . Bacteria, UA 08/30/2015 RARE* NONE SEEN Final  . Squamous Epithelial / LPF 08/30/2015 6-30* NONE SEEN Final  . Mucous 08/30/2015 PRESENT   Final  . Preg Test, Ur 08/30/2015 NEGATIVE  NEGATIVE Final  . Preg Test, Ur 08/30/2015 NEGATIVE  NEGATIVE Final   Comment:        THE SENSITIVITY OF THIS METHODOLOGY IS >24 mIU/mL   . Influenza A ([place]) 08/30/2015 NEGATIVE   Final  . Influenza B ([facility]) 08/30/2015 NEGATIVE   Final  . Troponin I 08/30/2015 <0.03  <0.031 ng/mL Final   Comment:        NO INDICATION OF MYOCARDIAL INJURY.     Assessment:  Cheyenne Barnes is a 20 y.o. female with a history of ITP dating back to 02/19/2013.  She was treated with steroids, but with prednisone taper, her platelet count fell.  Prednsione  doses < 30 mg a day caused her platelets to fall to < 50,000.  She received Rituxan from 05/04/2013 until 05/31/2013. Her platelet count has been normal since 05/31/2013.  She has had a recent history of excess bruising.  Exam reveals no petechiae.  Bruises are fading.  Platelet count is 219,000.  Plan: 1.  Labs today:  CBC with diff. 2.  Contact clinic with any excess bruising or bleeding for immediate CBC in clinic.   3.  Nurse to call patient with platelet results 4.  RTC prn.   Lequita Asal, MD  04/01/2016, 12:02 PM

## 2016-04-01 ENCOUNTER — Inpatient Hospital Stay: Payer: Self-pay

## 2016-04-01 ENCOUNTER — Inpatient Hospital Stay: Payer: Self-pay | Attending: Hematology and Oncology | Admitting: Hematology and Oncology

## 2016-04-01 VITALS — BP 104/70 | HR 70 | Temp 95.9°F | Resp 18 | Wt 189.6 lb

## 2016-04-01 DIAGNOSIS — Z79899 Other long term (current) drug therapy: Secondary | ICD-10-CM

## 2016-04-01 DIAGNOSIS — D693 Immune thrombocytopenic purpura: Secondary | ICD-10-CM

## 2016-04-01 DIAGNOSIS — R233 Spontaneous ecchymoses: Secondary | ICD-10-CM

## 2016-04-01 DIAGNOSIS — K0889 Other specified disorders of teeth and supporting structures: Secondary | ICD-10-CM

## 2016-04-01 LAB — CBC WITH DIFFERENTIAL/PLATELET
Basophils Absolute: 0 10*3/uL (ref 0–0.1)
Basophils Relative: 0 %
Eosinophils Absolute: 0.1 10*3/uL (ref 0–0.7)
Eosinophils Relative: 1 %
HCT: 40.4 % (ref 35.0–47.0)
Hemoglobin: 13.9 g/dL (ref 12.0–16.0)
Lymphocytes Relative: 38 %
Lymphs Abs: 2.9 10*3/uL (ref 1.0–3.6)
MCH: 32.7 pg (ref 26.0–34.0)
MCHC: 34.4 g/dL (ref 32.0–36.0)
MCV: 95 fL (ref 80.0–100.0)
Monocytes Absolute: 0.5 10*3/uL (ref 0.2–0.9)
Monocytes Relative: 7 %
Neutro Abs: 4.1 10*3/uL (ref 1.4–6.5)
Neutrophils Relative %: 54 %
Platelets: 219 10*3/uL (ref 150–440)
RBC: 4.25 MIL/uL (ref 3.80–5.20)
RDW: 14.4 % (ref 11.5–14.5)
WBC: 7.6 10*3/uL (ref 3.6–11.0)

## 2016-04-01 NOTE — Progress Notes (Signed)
Patient came in today for bruising. She has noted some bruises. She had on on lateral right thigh size of a softball. She does not remember hitting anything but had a bruise.

## 2016-04-12 ENCOUNTER — Emergency Department
Admission: EM | Admit: 2016-04-12 | Discharge: 2016-04-12 | Disposition: A | Discharge status: Home / Self Care | Payer: Medicaid Other | Attending: Student | Admitting: Student

## 2016-04-12 ENCOUNTER — Encounter: Payer: Self-pay | Admitting: Emergency Medicine

## 2016-04-12 DIAGNOSIS — S29019A Strain of muscle and tendon of unspecified wall of thorax, initial encounter: Secondary | ICD-10-CM

## 2016-04-12 DIAGNOSIS — Y999 Unspecified external cause status: Secondary | ICD-10-CM | POA: Insufficient documentation

## 2016-04-12 DIAGNOSIS — Y929 Unspecified place or not applicable: Secondary | ICD-10-CM | POA: Insufficient documentation

## 2016-04-12 DIAGNOSIS — Y9389 Activity, other specified: Secondary | ICD-10-CM | POA: Insufficient documentation

## 2016-04-12 DIAGNOSIS — X500XXA Overexertion from strenuous movement or load, initial encounter: Secondary | ICD-10-CM | POA: Insufficient documentation

## 2016-04-12 DIAGNOSIS — M62830 Muscle spasm of back: Secondary | ICD-10-CM

## 2016-04-12 DIAGNOSIS — S39012A Strain of muscle, fascia and tendon of lower back, initial encounter: Secondary | ICD-10-CM | POA: Insufficient documentation

## 2016-04-12 MED ORDER — NAPROXEN 500 MG PO TABS: 500.0000 mg | ORAL_TABLET | Freq: Two times a day (BID) | ORAL | 0 refills | Status: DC

## 2016-04-12 MED ORDER — CYCLOBENZAPRINE HCL 10 MG PO TABS: 10.0000 mg | ORAL_TABLET | Freq: Three times a day (TID) | ORAL | 0 refills | Status: DC | PRN

## 2016-04-12 NOTE — ED Notes (Signed)
States she developed pain to right flank area on Friday  States pain is non radiating and denies any urinary sx's  States pain increases thur out the day

## 2016-04-12 NOTE — ED Triage Notes (Signed)
Pt ambulatory to triage with c/o low back pain started two days ago.

## 2016-04-12 NOTE — ED Provider Notes (Signed)
[facility] Emergency Department Provider Note  ____________________________________________  Time seen: Approximately 8:02 AM  I have reviewed the triage vital signs and the nursing notes.   HISTORY  Chief Complaint Back Pain    HPI Jarrett AblesMarian R Achille is a 20 y.o. female , NAD, presents to the emergency department with three-day history of right middle back pain. Patient states she has had right lateral mid back pain for a few days. Denies any specific injury or trauma to cause the pain. 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 Felt the pain was more than likely a strain of similar nature and it seemed it would improve over the course of the day. Over the weekend pain seems to be worsening and can be sharp at times. Denies any radiation of pain. Has not noted any rashes or skin sores in the area. Denies any falls, injuries or traumas to the area. Has not had any chest pain or shortness of breath. Denies abdominal pain, nausea, vomiting. States that she used Voltaren gel on the area which helped some. Has not had any numbness, weakness, tingling, saddle paresthesias or loss of bowel or bladder control. Does note that the pain decreases when she bends forward and stretches the area.   Past Medical History:  Diagnosis Date  . 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 Thrombocytopenia (HCC) 02/19/2013    Patient Active Problem List   Diagnosis Date Noted  . 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 No pertinent surgical history.  Prior to Admission medications   Medication Sig Start Date End Date Taking? Authorizing Provider  acetaminophen (TYLENOL) 325 MG tablet Take 325 mg by mouth every 6 (six) hours as needed for moderate pain.    Historical Provider, MD  cyclobenzaprine (FLEXERIL) 10 MG tablet Take 1 tablet (10 mg total) by mouth 3  (three) times daily as needed for muscle spasms. 04/12/16   Gadge Hermiz L Cherilynn Schomburg, PA-C  naproxen (NAPROSYN) 500 MG tablet Take 1 tablet (500 mg total) by mouth 2 (two) times daily with a meal. 04/12/16   Justyce Yeater L Doron Shake, PA-C  Norgestimate-Ethinyl Estradiol Triphasic (TRINESSA, 28,) 0.18/0.215/0.25 MG-35 MCG tablet Take 1 tablet by mouth daily.    Historical Provider, MD  UNABLE TO FIND Med Name: Stackers    Historical Provider, MD    Allergies Codeine and Sulfa antibiotics  No family history on file.  Social History Social History  Substance Use Topics  . Smoking status: Never Smoker  . Smokeless tobacco: Never Used  . Alcohol use No     Review of Systems  Constitutional: No fever/chills Cardiovascular: No chest pain. Respiratory: No shortness of breath.  Gastrointestinal: No abdominal pain.  No nausea, vomiting. Musculoskeletal: Positive for back pain.  Skin: Negative for rash, redness, swelling, bruising, open wounds, skin sores. Neurological: Negative for headaches, focal weakness or numbness. No saddle paresthesias, loss of bowel or bladder control, tingling. 10-point ROS otherwise negative.  ____________________________________________   PHYSICAL EXAM:  VITAL SIGNS: ED Triage Vitals  Enc Vitals Group     BP 04/12/16 0750 109/77     Pulse Rate 04/12/16 0748 73     Resp 04/12/16 0748 18     Temp 04/12/16 0748 98.4 F (36.9 C)     Temp Source 04/12/16 0748 Oral     SpO2 04/12/16 0748 99 %     Weight 04/12/16 0749 188 lb (85.3  kg)     Height 04/12/16 0749 5\' 3"  (1.6 m)     Head Circumference --      Peak Flow --      Pain Score 04/12/16 0749 8     Pain Loc --      Pain Edu? --      Excl. in GC? --      Constitutional: Alert and oriented. Well appearing and in no acute distress. Eyes: Conjunctivae are normal Without icterus or injection  Head: Atraumatic. Neck: No cervical spine tenderness to palpation. Supple with full range of motion. No trapezial muscle  spasms. Hematological/Lymphatic/Immunilogical: No cervical lymphadenopathy. Cardiovascular: Good peripheral circulation. Respiratory: Normal respiratory effort without tachypnea or retractions.  Musculoskeletal: No tenderness to palpation about the thoracic, lumbar, sacral spinal areas. Muscle spasm is a appreciated about the mid, lateral thoracic muscular area, approximately the mid/lateral portion of the lattisimus dorsi muscle. No lower extremity tenderness nor edema.  No joint effusions. Neurologic:  Normal speech and language. No gross focal neurologic deficits are appreciated.  Skin:  Skin is warm, dry and intact. No rash, redness, swelling, bruising, skin sores noted. Psychiatric: Mood and affect are normal. Speech and behavior are normal. Patient exhibits appropriate insight and judgement.   ____________________________________________   LABS  None ____________________________________________  EKG  None ____________________________________________  RADIOLOGY  None ____________________________________________    PROCEDURES  Procedure(s) performed: None   Procedures   Medications - No data to display   ____________________________________________   INITIAL IMPRESSION / ASSESSMENT AND PLAN / ED COURSE  Pertinent labs & imaging results that were available during my care of the patient were reviewed by me and considered in my medical decision making (see chart for details).  Clinical Course    Patient's diagnosis is consistent with Right thoracic strain with muscle spasm of the back. Patient will be discharged home with prescriptions for naproxen and Flexeril to take as directed. Patient is to follow up with [facility] if symptoms persist past this treatment course. Patient is given ED precautions to return to the ED for any worsening or new symptoms.      ____________________________________________  FINAL CLINICAL IMPRESSION(S) / ED  DIAGNOSES  Final diagnoses:  Thoracic myofascial strain, initial encounter  Muscle spasm of back      NEW MEDICATIONS STARTED DURING THIS VISIT:  Discharge Medication List as of 04/12/2016  8:11 AM    START taking these medications   Details  cyclobenzaprine (FLEXERIL) 10 MG tablet Take 1 tablet (10 mg total) by mouth 3 (three) times daily as needed for muscle spasms., Starting Mon 04/12/2016, Print    naproxen (NAPROSYN) 500 MG tablet Take 1 tablet (500 mg total) by mouth 2 (two) times daily with a meal., Starting Mon 04/12/2016, Print             Ernestene Kiel Wildwood, PA-C 04/12/16 0840    Gayla Doss, MD 04/12/16 [phone]

## 2016-04-19 ENCOUNTER — Encounter: Payer: Self-pay | Admitting: Hematology and Oncology

## 2016-06-03 ENCOUNTER — Other Ambulatory Visit: Payer: Self-pay | Admitting: *Deleted

## 2016-06-03 DIAGNOSIS — D693 Immune thrombocytopenic purpura: Secondary | ICD-10-CM

## 2016-06-04 ENCOUNTER — Inpatient Hospital Stay: Payer: Medicaid Other | Attending: Hematology and Oncology

## 2016-06-04 DIAGNOSIS — D693 Immune thrombocytopenic purpura: Secondary | ICD-10-CM | POA: Insufficient documentation

## 2016-06-04 LAB — CBC WITH DIFFERENTIAL/PLATELET
Basophils Absolute: 0 10*3/uL (ref 0–0.1)
Basophils Relative: 0 %
Eosinophils Absolute: 0.1 10*3/uL (ref 0–0.7)
Eosinophils Relative: 1 %
HCT: 40.9 % (ref 35.0–47.0)
Hemoglobin: 14.1 g/dL (ref 12.0–16.0)
Lymphocytes Relative: 27 %
Lymphs Abs: 1.7 10*3/uL (ref 1.0–3.6)
MCH: 33 pg (ref 26.0–34.0)
MCHC: 34.6 g/dL (ref 32.0–36.0)
MCV: 95.3 fL (ref 80.0–100.0)
Monocytes Absolute: 0.5 10*3/uL (ref 0.2–0.9)
Monocytes Relative: 8 %
Neutro Abs: 4 10*3/uL (ref 1.4–6.5)
Neutrophils Relative %: 64 %
Platelets: 210 10*3/uL (ref 150–440)
RBC: 4.29 MIL/uL (ref 3.80–5.20)
RDW: 12.6 % (ref 11.5–14.5)
WBC: 6.3 10*3/uL (ref 3.6–11.0)

## 2016-08-27 ENCOUNTER — Encounter: Payer: Self-pay | Admitting: Emergency Medicine

## 2016-08-27 ENCOUNTER — Emergency Department
Admission: EM | Admit: 2016-08-27 | Discharge: 2016-08-27 | Disposition: A | Discharge status: Home / Self Care | Payer: No Typology Code available for payment source | Attending: Emergency Medicine | Admitting: Emergency Medicine

## 2016-08-27 DIAGNOSIS — Z79899 Other long term (current) drug therapy: Secondary | ICD-10-CM | POA: Diagnosis not present

## 2016-08-27 DIAGNOSIS — S29012A Strain of muscle and tendon of back wall of thorax, initial encounter: Secondary | ICD-10-CM | POA: Insufficient documentation

## 2016-08-27 DIAGNOSIS — S299XXA Unspecified injury of thorax, initial encounter: Secondary | ICD-10-CM | POA: Diagnosis present

## 2016-08-27 DIAGNOSIS — Y92410 Unspecified street and highway as the place of occurrence of the external cause: Secondary | ICD-10-CM | POA: Diagnosis not present

## 2016-08-27 DIAGNOSIS — S29019A Strain of muscle and tendon of unspecified wall of thorax, initial encounter: Secondary | ICD-10-CM

## 2016-08-27 DIAGNOSIS — Y999 Unspecified external cause status: Secondary | ICD-10-CM | POA: Diagnosis not present

## 2016-08-27 DIAGNOSIS — Y9389 Activity, other specified: Secondary | ICD-10-CM | POA: Insufficient documentation

## 2016-08-27 MED ORDER — CYCLOBENZAPRINE HCL 10 MG PO TABS: 10.0000 mg | ORAL_TABLET | Freq: Three times a day (TID) | ORAL | 0 refills | Status: DC | PRN
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
## 2016-08-27 NOTE — Discharge Instructions (Signed)
You may take tylenol in addition to the flexeril.  Follow up with the primary care provider of your choice for symptoms that are not improving over the week. Return to the ER for symptoms that change or worsen if unable to schedule an appointment.

## 2017-01-21 ENCOUNTER — Telehealth: Payer: Self-pay | Admitting: *Deleted

## 2017-01-21 DIAGNOSIS — D693 Immune thrombocytopenic purpura: Secondary | ICD-10-CM

## 2017-01-21 NOTE — Telephone Encounter (Addendum)
Reports she is having several large bruises again and is asking to have her platelets checked I have scheduled her to come in Monday at 8 am

## 2017-01-21 NOTE — Telephone Encounter (Signed)
-----   Message from Fernand Parkinsiana Scruggs sent at 01/21/2017 10:47 AM EDT ----- Contact: [phone] PT L/M that she may need her labs drawn-Doesn't have an appt right now...was seen in 10-17  Thanks ladies

## 2017-01-21 NOTE — Telephone Encounter (Signed)
Assessment:  Cheyenne AblesMarian R Barnes is a 21 y.o. female with a history of ITP dating back to 02/19/2013.  She was treated with steroids, but with prednisone taper, her platelet count fell.  Prednsione doses < 30 mg a day caused her platelets to fall to < 50,000.  She received Rituxan from 05/04/2013 until 05/31/2013. Her platelet count has been normal since 05/31/2013.  She has had a recent history of excess bruising.  Exam reveals no petechiae.  Bruises are fading.  Platelet count is 219,000.  Plan: 1.  Labs today:  CBC with diff. 2.  Contact clinic with any excess bruising or bleeding for immediate CBC in clinic.   3.  Nurse to call patient with platelet results 4.  RTC prn.   Rosey BathMelissa C Corcoran, MD  04/01/2016, 12:02 PM  I attempted to call patient, but message that she does not have VM. Will attempt to call again later

## 2017-01-24 ENCOUNTER — Inpatient Hospital Stay: Payer: Self-pay | Attending: Hematology and Oncology

## 2017-01-24 DIAGNOSIS — D693 Immune thrombocytopenic purpura: Secondary | ICD-10-CM | POA: Insufficient documentation

## 2017-01-24 LAB — CBC WITH DIFFERENTIAL/PLATELET
Basophils Absolute: 0 10*3/uL (ref 0–0.1)
Basophils Relative: 1 %
Eosinophils Absolute: 0.1 10*3/uL (ref 0–0.7)
Eosinophils Relative: 3 %
HCT: 40.2 % (ref 35.0–47.0)
Hemoglobin: 14 g/dL (ref 12.0–16.0)
Lymphocytes Relative: 30 %
Lymphs Abs: 1.5 10*3/uL (ref 1.0–3.6)
MCH: 33.7 pg (ref 26.0–34.0)
MCHC: 34.7 g/dL (ref 32.0–36.0)
MCV: 96.9 fL (ref 80.0–100.0)
Monocytes Absolute: 0.5 10*3/uL (ref 0.2–0.9)
Monocytes Relative: 10 %
Neutro Abs: 2.8 10*3/uL (ref 1.4–6.5)
Neutrophils Relative %: 56 %
Platelets: 187 10*3/uL (ref 150–440)
RBC: 4.15 MIL/uL (ref 3.80–5.20)
RDW: 13.4 % (ref 11.5–14.5)
WBC: 4.9 10*3/uL (ref 3.6–11.0)

## 2017-02-12 ENCOUNTER — Emergency Department
Admission: EM | Admit: 2017-02-12 | Discharge: 2017-02-12 | Disposition: A | Discharge status: Home / Self Care | Payer: Self-pay | Attending: Emergency Medicine | Admitting: Emergency Medicine

## 2017-02-12 ENCOUNTER — Encounter: Payer: Self-pay | Admitting: Emergency Medicine

## 2017-02-12 DIAGNOSIS — L03115 Cellulitis of right lower limb: Secondary | ICD-10-CM | POA: Insufficient documentation

## 2017-02-12 DIAGNOSIS — Z793 Long term (current) use of hormonal contraceptives: Secondary | ICD-10-CM | POA: Insufficient documentation

## 2017-02-12 MED ORDER — METOCLOPRAMIDE HCL 5 MG/ML IJ SOLN: 10.0000 mg | Freq: Once | INTRAMUSCULAR | Status: DC

## 2017-02-12 MED ORDER — TRIAMCINOLONE ACETONIDE 0.5 % EX OINT: 1.0000 "application " | TOPICAL_OINTMENT | Freq: Two times a day (BID) | CUTANEOUS | 0 refills | Status: DC

## 2017-02-12 MED ORDER — CEPHALEXIN 500 MG PO CAPS: 500.0000 mg | ORAL_CAPSULE | Freq: Two times a day (BID) | ORAL | 0 refills | Status: AC

## 2017-02-12 MED ORDER — GI COCKTAIL ~~LOC~~: 30.0000 mL | Freq: Once | ORAL | Status: DC

## 2017-02-12 NOTE — ED Notes (Signed)
Pt. Verbalizes understanding of discharge instructions.

## 2017-02-12 NOTE — ED Notes (Addendum)
Pt reports tenderness to right thigh reports she might had an insect bite reports redness has been increasing reports draw a circle around redness and overnight has gotten bigger, Upon assessment area is red, tender and warm to touch. Pt denies any other symptom

## 2017-02-12 NOTE — Discharge Instructions (Signed)
Follow up with the primary care provider for choice for symptoms that are not improving over the next 2-3 days. Return to the emergency department for symptoms that change or worsen if her unable to schedule an appointment.

## 2017-02-12 NOTE — ED Provider Notes (Signed)
[facility] Emergency Department Provider Note  ____________________________________________  Time seen: Approximately 10:18 AM  I have reviewed the triage vital signs and the nursing notes.   HISTORY  Chief Complaint Cellulitis and Insect Bite   HPI Cheyenne Barnes is a 21 y.o. female who presents to the emergency department for evaluation of a red area to her right upper thighfor the past 2 days. She states her boyfriend saw 2 spiders on the couch and she isn't sure if she got bit. Red area has significantly increased in size since she first noticed it. It was just itching at first, but now has become tender and hot to touch.  Past Medical History:  Diagnosis Date  . 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 Thrombocytopenia (HCC) 02/19/2013    Patient Active Problem List   Diagnosis Date Noted  . Idiopathic thrombocytopenic purpura (HCC) 12/11/2014    History reviewed. No pertinent surgical history.  Prior to Admission medications   Medication Sig Start Date End Date Taking? Authorizing Provider  acetaminophen (TYLENOL) 325 MG tablet Take 325 mg by mouth every 6 (six) hours as needed for moderate pain.    [provider]  cephALEXin (KEFLEX) 500 MG capsule Take 1 capsule (500 mg total) by mouth 2 (two) times daily. 02/12/17 02/22/17  Sharice Harriss, Kasandra Knudsen, FNP  cyclobenzaprine (FLEXERIL) 10 MG tablet Take 1 tablet (10 mg total) by mouth 3 (three) times daily as needed for muscle spasms. 08/27/16   Loriene Taunton, Rulon Eisenmenger B, FNP  Norgestimate-Ethinyl Estradiol Triphasic (TRINESSA, 28,) 0.18/0.215/0.25 MG-35 MCG tablet Take 1 tablet by mouth daily.    [provider]  triamcinolone ointment (KENALOG) 0.5 % Apply 1 application topically 2 (two) times daily. 02/12/17   Ermine Spofford, Kasandra Knudsen, FNP  UNABLE TO FIND Med Name: Stackers    [provider]    Allergies Codeine and Sulfa antibiotics  No family history on file.  Social  History Social History  Substance Use Topics  . Smoking status: Never Smoker  . Smokeless tobacco: Never Used  . Alcohol use No    Review of Systems  Constitutional: Negative for fever.  Respiratory: Negative for shortness of breath.  Musculoskeletal: Positive for right thigh pain.  Skin: Positive for erythema and increased skin temperature of the right upper thigh. Neurological: Negative for weakness or paresthesias. ____________________________________________   PHYSICAL EXAM:  VITAL SIGNS: ED Triage Vitals [02/12/17 0932]  Enc Vitals Group     BP 112/60     Pulse Rate 96     Resp 18     Temp 98.6 F (37 C)     Temp Source Oral     SpO2 96 %     Weight 183 lb (83 kg)     Height 5\' 3"  (1.6 m)     Head Circumference      Peak Flow      Pain Score      Pain Loc      Pain Edu?      Excl. in GC?      Constitutional: Well appearing. Eyes: Conjunctivae are negative for erythema, discharge, or drainage. Nose: Negative for rhinorrhea. Mouth/Throat: Airway is patent. No circumoral edema. Uvula is midline. Neck: No stridor.  Cardiovascular: Capillary refill is <3 seconds. Respiratory: Respirations are even and unlabored. Breath sounds clear throughout. Musculoskeletal: Active, full range of motion x 4 extremities. Neurologic: No weakness observed. Skin:  10cm diameter maculopapular area of erythema to the right lateral thigh. Two pinpoint areas  identified consistent with some type of bite.   ____________________________________________   LABS (all labs ordered are listed, but only abnormal results are displayed)  Labs Reviewed - No data to display ____________________________________________  EKG  Not indicated. ____________________________________________  RADIOLOGY  Not indicated. ____________________________________________   PROCEDURES  Procedure(s) performed: None ____________________________________________   INITIAL IMPRESSION / ASSESSMENT  AND PLAN / ED COURSE  Jarrett AblesMarian R Jolicoeur is a 21 y.o. female who presents to the emergency department for evaluation and treatment of symptoms and exam consistent with cellulitis secondary to insect/spider bite. She'll be treated with Keflex and triamcinolone. She was encouraged to follow up with primary care provider for symptoms that are not improving over the next 2-3 days. She was advised to return to the emergency department for symptoms that change or worsen if she is unable schedule an appointment.   Pertinent labs & imaging results that were available during my care of the patient were reviewed by me and considered in my medical decision making (see chart for details). ____________________________________________   FINAL CLINICAL IMPRESSION(S) / ED DIAGNOSES  Final diagnoses:  Cellulitis of right lower extremity    Discharge Medication List as of 02/12/2017 10:25 AM    START taking these medications   Details  cephALEXin (KEFLEX) 500 MG capsule Take 1 capsule (500 mg total) by mouth 2 (two) times daily., Starting Sat 02/12/2017, Until Tue 02/22/2017, Print    triamcinolone ointment (KENALOG) 0.5 % Apply 1 application topically 2 (two) times daily., Starting Sat 02/12/2017, Print        If controlled substance prescribed during this visit, 12 month history viewed on the [facility] prior to issuing an initial prescription for Schedule II or III opiod.   Note:  This document was prepared using Dragon voice recognition software and may include unintentional dictation errors.    Chinita Pesterriplett, Keshia Weare B, FNP 02/12/17 1045    Governor RooksLord, Rebecca, MD 02/12/17 [phone]

## 2017-02-12 NOTE — ED Triage Notes (Signed)
Patient to ER for c/o reddened area to right outer thigh that is approx 3 inches in diameter. States she noticed spiders on her couch, unknown if she was bitten by one, but slept on the couch x2 nights. No drainage noted. Patient reports itching to site.

## 2017-09-15 ENCOUNTER — Other Ambulatory Visit: Payer: Self-pay

## 2017-09-15 ENCOUNTER — Emergency Department
Admission: EM | Admit: 2017-09-15 | Discharge: 2017-09-15 | Disposition: A | Discharge status: Home / Self Care | Payer: Self-pay | Attending: Emergency Medicine | Admitting: Emergency Medicine

## 2017-09-15 DIAGNOSIS — Z79899 Other long term (current) drug therapy: Secondary | ICD-10-CM | POA: Insufficient documentation

## 2017-09-15 DIAGNOSIS — J069 Acute upper respiratory infection, unspecified: Secondary | ICD-10-CM | POA: Insufficient documentation

## 2017-09-15 DIAGNOSIS — B9789 Other viral agents as the cause of diseases classified elsewhere: Secondary | ICD-10-CM | POA: Insufficient documentation

## 2017-09-15 HISTORY — DX: Personal history of diseases of the blood and blood-forming organs and certain disorders involving the immune mechanism: Z86.2

## 2017-09-15 LAB — GROUP A STREP BY PCR: Group A Strep by PCR: NOT DETECTED

## 2017-09-15 MED ORDER — PREDNISONE 10 MG PO TABS: ORAL_TABLET | ORAL | 0 refills | Status: DC

## 2017-09-15 NOTE — ED Provider Notes (Signed)
[facility] Emergency Department Provider Note  ____________________________________________   First MD Initiated Contact with Patient 09/15/17 1423     (approximate)  I have reviewed the triage vital signs and the nursing notes.   HISTORY  Chief Complaint Sore Throat   HPI Cheyenne Barnes is a 22 y.o. female is here with complaint of sore throat, nasal congestion and fever.  Patient states she has been taking NyQuil and DayQuil for several days with minimal improvement.  Patient denies any ear pain.  She rates her pain is not over 10.   Past Medical History:  Diagnosis Date  . History of ITP   . 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 Thrombocytopenia (HCC) 02/19/2013    Patient Active Problem List   Diagnosis Date Noted  . Idiopathic thrombocytopenic purpura (HCC) 12/11/2014    History reviewed. No pertinent surgical history.  Prior to Admission medications   Medication Sig Start Date End Date Taking? Authorizing Provider  acetaminophen (TYLENOL) 325 MG tablet Take 325 mg by mouth every 6 (six) hours as needed for moderate pain.    [provider]  Norgestimate-Ethinyl Estradiol Triphasic (TRINESSA, 28,) 0.18/0.215/0.25 MG-35 MCG tablet Take 1 tablet by mouth daily.    [provider]  predniSONE (DELTASONE) 10 MG tablet Take 3 tablets once a day for next 3 days 09/15/17   Tommi Rumps, PA-C  UNABLE TO FIND Med Name: Stackers    [provider]    Allergies Codeine and Sulfa antibiotics  No family history on file.  Social History Social History   Tobacco Use  . Smoking status: Never Smoker  . Smokeless tobacco: Never Used  Substance Use Topics  . Alcohol use: No  . Drug use: Yes    Types: Marijuana    Review of Systems Constitutional: No fever/chills Eyes: No visual changes. ENT: Positive for sore throat.  Positive for nasal congestion. Cardiovascular: Denies chest  pain. Respiratory: Denies shortness of breath.  Positive for nonproductive cough. Gastrointestinal: No abdominal pain.  No nausea, no vomiting.   Genitourinary: Negative for dysuria. Musculoskeletal: Negative for back pain. Skin: Negative for rash. Neurological: Negative for headaches, focal weakness or numbness. ___________________________________________   PHYSICAL EXAM:  VITAL SIGNS: ED Triage Vitals [09/15/17 1345]  Enc Vitals Group     BP 124/81     Pulse Rate 93     Resp 16     Temp 98.1 F (36.7 C)     Temp Source Oral     SpO2 99 %     Weight 187 lb (84.8 kg)     Height 5\' 4"  (1.626 m)     Head Circumference      Peak Flow      Pain Score 9     Pain Loc      Pain Edu?      Excl. in GC?    Constitutional: Alert and oriented. Well appearing and in no acute distress. Eyes: Conjunctivae are normal.  Head: Atraumatic. Nose: Moderate congestion/rhinnorhea.  TMs are dull bilaterally. Mouth/Throat: Mucous membranes are moist.  Oropharynx with moderate posterior drainage.  Throat is pale and uvula is midline. Neck: No stridor.   Hematological/Lymphatic/Immunilogical: Minimal bilateral cervical lymphadenopathy. Cardiovascular: Normal rate, regular rhythm. Grossly normal heart sounds.  Good peripheral circulation. Respiratory: Normal respiratory effort.  No retractions. Lungs CTAB.  Musculoskeletal: Moves upper and lower extremities without any difficulty.  Normal gait was noted. Neurologic:  Normal speech and language. No gross focal  neurologic deficits are appreciated.  Skin:  Skin is warm, dry and intact. No rash noted. Psychiatric: Mood and affect are normal. Speech and behavior are normal.  ____________________________________________   LABS (all labs ordered are listed, but only abnormal results are displayed)  Labs Reviewed  GROUP A STREP BY PCR     PROCEDURES  Procedure(s) performed: None  Procedures  Critical Care performed:  No  ____________________________________________   INITIAL IMPRESSION / ASSESSMENT AND PLAN / ED COURSE Patient was offered Flonase nasal spray which she states she cannot do nasal sprays due to vomiting.  Patient will continue using NyQuil, DayQuil, Tylenol as needed for symptoms.  She is also given a prescription for prednisone 30 mg once a day for the 3 days.  She is to [facility] PCP [facility] if any continued problems.  ____________________________________________   FINAL CLINICAL IMPRESSION(S) / ED DIAGNOSES  Final diagnoses:  Viral URI with cough     ED Discharge Orders        Ordered    predniSONE (DELTASONE) 10 MG tablet     09/15/17 1516       Note:  This document was prepared using Dragon voice recognition software and may include unintentional dictation errors.    Tommi RumpsSummers, Taleen Prosser L, PA-C 09/15/17 1523    Minna AntisPaduchowski, Kevin, MD 09/15/17 [phone]

## 2017-09-15 NOTE — ED Notes (Signed)
See triage note  Presents with a 3 day hx of sore throat and fever  States temp at home was 101 last pm   Also having increased pain with swallowing and some swelling noted to glands

## 2017-09-15 NOTE — Discharge Instructions (Signed)
Follow-up with [facility] acute care if any continued problems.  Begin taking prednisone for the next 3 days.  Continue taking DayQuil and NyQuil as you have been doing.  Increase fluids.  Tylenol or ibuprofen as needed for throat pain, body aches, fever.

## 2017-09-15 NOTE — ED Triage Notes (Signed)
Pt reports sore throat and difficulty swallowing - nasal congestion - fever (max 101.5)
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
## 2018-01-09 ENCOUNTER — Encounter: Payer: Self-pay | Admitting: Emergency Medicine

## 2018-01-09 ENCOUNTER — Emergency Department: Payer: Medicaid Other | Admitting: Anesthesiology

## 2018-01-09 ENCOUNTER — Emergency Department
Admission: EM | Admit: 2018-01-09 | Discharge: 2018-01-09 | Disposition: A | Discharge status: Home / Self Care | Payer: Medicaid Other | Attending: Emergency Medicine | Admitting: Emergency Medicine

## 2018-01-09 ENCOUNTER — Emergency Department: Payer: Medicaid Other

## 2018-01-09 ENCOUNTER — Other Ambulatory Visit: Payer: Self-pay

## 2018-01-09 ENCOUNTER — Encounter
Admission: EM | Disposition: A | Discharge status: Home / Self Care | Payer: Self-pay | Source: Home / Self Care | Attending: Emergency Medicine

## 2018-01-09 DIAGNOSIS — Z79890 Hormone replacement therapy (postmenopausal): Secondary | ICD-10-CM | POA: Insufficient documentation

## 2018-01-09 DIAGNOSIS — J36 Peritonsillar abscess: Secondary | ICD-10-CM | POA: Insufficient documentation

## 2018-01-09 HISTORY — PX: INCISION AND DRAINAGE OF PERITONSILLAR ABCESS: SHX6257

## 2018-01-09 LAB — CBC WITH DIFFERENTIAL/PLATELET
BASOS ABS: 0 10*3/uL (ref 0–0.1)
BASOS PCT: 0 %
Eosinophils Absolute: 0 10*3/uL (ref 0–0.7)
Eosinophils Relative: 0 %
HEMATOCRIT: 43.6 % (ref 35.0–47.0)
Hemoglobin: 14.9 g/dL (ref 12.0–16.0)
Lymphocytes Relative: 11 %
Lymphs Abs: 1.5 10*3/uL (ref 1.0–3.6)
MCH: 34.5 pg — ABNORMAL HIGH (ref 26.0–34.0)
MCHC: 34.1 g/dL (ref 32.0–36.0)
MCV: 101.2 fL — ABNORMAL HIGH (ref 80.0–100.0)
MONO ABS: 1 10*3/uL — AB (ref 0.2–0.9)
MONOS PCT: 8 %
Neutro Abs: 10.8 10*3/uL — ABNORMAL HIGH (ref 1.4–6.5)
Neutrophils Relative %: 81 %
PLATELETS: 246 10*3/uL (ref 150–440)
RBC: 4.31 MIL/uL (ref 3.80–5.20)
RDW: 13.7 % (ref 11.5–14.5)
WBC: 13.4 10*3/uL — ABNORMAL HIGH (ref 3.6–11.0)

## 2018-01-09 LAB — COMPREHENSIVE METABOLIC PANEL
ALBUMIN: 3.8 g/dL (ref 3.5–5.0)
ALK PHOS: 112 U/L (ref 38–126)
ALT: 14 U/L (ref 14–54)
AST: 25 U/L (ref 15–41)
Anion gap: 8 (ref 5–15)
BUN: 9 mg/dL (ref 6–20)
CALCIUM: 9.4 mg/dL (ref 8.9–10.3)
CO2: 26 mmol/L (ref 22–32)
CREATININE: 0.63 mg/dL (ref 0.44–1.00)
Chloride: 103 mmol/L (ref 101–111)
GFR calc Af Amer: >60 mL/min (ref >60)
GFR calc non Af Amer: >60 mL/min (ref >60)
GLUCOSE: 113 mg/dL — AB (ref 65–99)
Potassium: 3.8 mmol/L (ref 3.5–5.1)
SODIUM: 137 mmol/L (ref 135–145)
Total Bilirubin: 1.2 mg/dL (ref 0.3–1.2)
Total Protein: 7.9 g/dL (ref 6.5–8.1)

## 2018-01-09 LAB — MONONUCLEOSIS SCREEN: Mono Screen: NEGATIVE

## 2018-01-09 LAB — GROUP A STREP BY PCR: Group A Strep by PCR: NOT DETECTED

## 2018-01-09 LAB — LACTIC ACID, PLASMA
LACTIC ACID, VENOUS: 1.2 mmol/L (ref 0.5–1.9)
Lactic Acid, Venous: 1.7 mmol/L (ref 0.5–1.9)

## 2018-01-09 SURGERY — INCISION AND DRAINAGE, ABSCESS, PERITONSILLAR
Anesthesia: General

## 2018-01-09 MED ORDER — ONDANSETRON HCL 4 MG/2ML IJ SOLN
INTRAMUSCULAR | Status: DC | PRN
  Administered 2018-01-09: 4 mg via INTRAVENOUS

## 2018-01-09 MED ORDER — MIDAZOLAM HCL 2 MG/2ML IJ SOLN
INTRAMUSCULAR | Status: AC
  Filled 2018-01-09: qty 2

## 2018-01-09 MED ORDER — PROMETHAZINE HCL 25 MG/ML IJ SOLN: 6.2500 mg | INTRAMUSCULAR | Status: DC | PRN

## 2018-01-09 MED ORDER — FENTANYL CITRATE (PF) 100 MCG/2ML IJ SOLN
INTRAMUSCULAR | Status: AC
  Administered 2018-01-09: 25 ug via INTRAVENOUS
  Filled 2018-01-09: qty 2

## 2018-01-09 MED ORDER — FENTANYL CITRATE (PF) 100 MCG/2ML IJ SOLN
25.0000 ug | INTRAMUSCULAR | Status: AC | PRN
  Administered 2018-01-09 (×6): 25 ug via INTRAVENOUS

## 2018-01-09 MED ORDER — BUPIVACAINE-EPINEPHRINE (PF) 0.5% -1:200000 IJ SOLN
INTRAMUSCULAR | Status: DC | PRN
  Administered 2018-01-09: 4 mL via PERINEURAL

## 2018-01-09 MED ORDER — LIDOCAINE HCL (CARDIAC) PF 100 MG/5ML IV SOSY
PREFILLED_SYRINGE | INTRAVENOUS | Status: DC | PRN
  Administered 2018-01-09: 100 mg via INTRAVENOUS

## 2018-01-09 MED ORDER — FENTANYL CITRATE (PF) 100 MCG/2ML IJ SOLN
INTRAMUSCULAR | Status: AC
  Filled 2018-01-09: qty 2

## 2018-01-09 MED ORDER — FENTANYL CITRATE (PF) 100 MCG/2ML IJ SOLN
INTRAMUSCULAR | Status: DC | PRN
  Administered 2018-01-09: 100 ug via INTRAVENOUS
  Administered 2018-01-09 (×2): 50 ug via INTRAVENOUS

## 2018-01-09 MED ORDER — AMOXICILLIN-POT CLAVULANATE 875-125 MG PO TABS: 1.0000 | ORAL_TABLET | Freq: Two times a day (BID) | ORAL | 0 refills | Status: AC

## 2018-01-09 MED ORDER — SUCCINYLCHOLINE CHLORIDE 20 MG/ML IJ SOLN
INTRAMUSCULAR | Status: DC | PRN
  Administered 2018-01-09: 160 mg via INTRAVENOUS

## 2018-01-09 MED ORDER — HYDROCODONE-ACETAMINOPHEN 7.5-325 MG/15ML PO SOLN: 15.0000 mL | Freq: Four times a day (QID) | ORAL | 0 refills | Status: AC | PRN

## 2018-01-09 MED ORDER — GLYCOPYRROLATE 0.2 MG/ML IJ SOLN
INTRAMUSCULAR | Status: DC | PRN
  Administered 2018-01-09: 0.2 mg via INTRAVENOUS

## 2018-01-09 MED ORDER — LORAZEPAM 2 MG/ML IJ SOLN: 1.0000 mg | Freq: Once | INTRAMUSCULAR | Status: DC

## 2018-01-09 MED ORDER — DEXAMETHASONE SODIUM PHOSPHATE 10 MG/ML IJ SOLN
INTRAMUSCULAR | Status: DC | PRN
  Administered 2018-01-09: 10 mg via INTRAVENOUS

## 2018-01-09 MED ORDER — CEFAZOLIN SODIUM-DEXTROSE 2-3 GM-%(50ML) IV SOLR
INTRAVENOUS | Status: DC | PRN
  Administered 2018-01-09: 2 g via INTRAVENOUS

## 2018-01-09 MED ORDER — ACETAMINOPHEN 10 MG/ML IV SOLN
INTRAVENOUS | Status: AC
  Filled 2018-01-09: qty 100

## 2018-01-09 MED ORDER — ROCURONIUM BROMIDE 100 MG/10ML IV SOLN
INTRAVENOUS | Status: DC | PRN
  Administered 2018-01-09: 25 mg via INTRAVENOUS
  Administered 2018-01-09: 5 mg via INTRAVENOUS

## 2018-01-09 MED ORDER — ACETAMINOPHEN 10 MG/ML IV SOLN
INTRAVENOUS | Status: DC | PRN
  Administered 2018-01-09: 1000 mg via INTRAVENOUS

## 2018-01-09 MED ORDER — SODIUM CHLORIDE 0.9 % IV SOLN
3.0000 g | Freq: Once | INTRAVENOUS | Status: AC
  Administered 2018-01-09: 3 g via INTRAVENOUS
  Filled 2018-01-09: qty 3

## 2018-01-09 MED ORDER — MIDAZOLAM HCL 2 MG/2ML IJ SOLN
INTRAMUSCULAR | Status: DC | PRN
  Administered 2018-01-09: 2 mg via INTRAVENOUS

## 2018-01-09 MED ORDER — SODIUM CHLORIDE 0.9 % IV BOLUS
1000.0000 mL | Freq: Once | INTRAVENOUS | Status: AC
  Administered 2018-01-09: 1000 mL via INTRAVENOUS

## 2018-01-09 MED ORDER — DEXAMETHASONE SODIUM PHOSPHATE 10 MG/ML IJ SOLN
10.0000 mg | Freq: Once | INTRAMUSCULAR | Status: AC
  Administered 2018-01-09: 10 mg via INTRAVENOUS
  Filled 2018-01-09: qty 1

## 2018-01-09 MED ORDER — IOPAMIDOL (ISOVUE-300) INJECTION 61%
75.0000 mL | Freq: Once | INTRAVENOUS | Status: AC | PRN
  Administered 2018-01-09: 75 mL via INTRAVENOUS
  Filled 2018-01-09: qty 75

## 2018-01-09 MED ORDER — PROPOFOL 10 MG/ML IV BOLUS
INTRAVENOUS | Status: DC | PRN
  Administered 2018-01-09: 200 mg via INTRAVENOUS

## 2018-01-09 MED ORDER — LACTATED RINGERS IV SOLN
INTRAVENOUS | Status: DC | PRN
  Administered 2018-01-09: 22:00:00 via INTRAVENOUS

## 2018-01-09 MED ORDER — SUGAMMADEX SODIUM 500 MG/5ML IV SOLN
INTRAVENOUS | Status: DC | PRN
  Administered 2018-01-09: 400 mg via INTRAVENOUS

## 2018-01-09 MED ORDER — METHYLPREDNISOLONE 4 MG PO TBPK: ORAL_TABLET | ORAL | 0 refills | Status: DC

## 2018-01-09 SURGICAL SUPPLY — 13 items
CANISTER SUCT 1200ML W/VALVE (MISCELLANEOUS) ×3 IMPLANT
ELECT CAUTERY BLADE TIP 2.5 (TIP) ×3
ELECT REM PT RETURN 9FT ADLT (ELECTROSURGICAL) ×3
ELECTRODE CAUTERY BLDE TIP 2.5 (TIP) ×1 IMPLANT
ELECTRODE REM PT RTRN 9FT ADLT (ELECTROSURGICAL) ×1 IMPLANT
GLOVE BIO SURGEON STRL SZ7.5 (GLOVE) ×3 IMPLANT
HANDLE SUCTION POOLE (INSTRUMENTS) ×1 IMPLANT
NDL SAFETY ECLIPSE 18X1.5 (NEEDLE) ×1 IMPLANT
NEEDLE HYPO 18GX1.5 SHARP (NEEDLE) ×2
NS IRRIG 500ML POUR BTL (IV SOLUTION) ×3 IMPLANT
PACK HEAD/NECK (MISCELLANEOUS) ×3 IMPLANT
SUCTION POOLE HANDLE (INSTRUMENTS) ×3
SWAB CULTURE AMIES ANAERIB BLU (MISCELLANEOUS) ×3 IMPLANT

## 2018-01-09 NOTE — H&P (Signed)
H+P short form filled out by hand with pertinent information as below.  Patient has a few days of throat pain, difficulty swallowing, and muffled voice. CT demonstrates 1.5 cm right peritonsillar abscess. She has had difficulty taking anything by mouth. We discussed operative intervention versus conservative measures with abx and observation. I recommended the former. The risks were reviewed including bleeding, infection, risks of anesthesia. We discussed her history of ITP; she has historically been followed by hematology but was recommended prn f/u as labs have been normal since 2014. Her platelets today are normal. We discussed this likely increases her bleeding risk slightly.

## 2018-01-09 NOTE — Discharge Instructions (Signed)

## 2018-01-09 NOTE — Anesthesia Preprocedure Evaluation (Signed)
Anesthesia Evaluation  Patient identified by MRN, date of birth, ID band Patient awake    Reviewed: Allergy & Precautions, H&P , NPO status , Patient's Chart, lab work & pertinent test results  Airway Mallampati: III  TM Distance: >3 FB Neck ROM: full    Dental  (+) Chipped, Poor Dentition   Pulmonary neg pulmonary ROS, neg shortness of breath, Current Smoker,           Cardiovascular Exercise Tolerance: Good negative cardio ROS       Neuro/Psych PSYCHIATRIC DISORDERS Anxiety negative neurological ROS  negative psych ROS   GI/Hepatic negative GI ROS, Neg liver ROS, neg GERD  ,  Endo/Other  negative endocrine ROS  Renal/GU      Musculoskeletal   Abdominal   Peds  Hematology  (+) Blood dyscrasia, ,   Anesthesia Other Findings Peritonsillar abscess   Past Medical History: No date: 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 No pertinent surgical history.  BMI    Body Mass Index:  32.95 kg/m      Reproductive/Obstetrics negative OB ROS                             Anesthesia Physical Anesthesia Plan  ASA: IV and emergent  Anesthesia Plan: General ETT, Rapid Sequence and Cricoid Pressure   Post-op Pain Management:    Induction: Intravenous  PONV Risk Score and Plan: Ondansetron, Dexamethasone and Midazolam  Airway Management Planned: Oral ETT and Video Laryngoscope Planned  Additional Equipment:   Intra-op Plan:   Post-operative Plan: Extubation in OR  Informed Consent: I have reviewed the patients History and Physical, chart, labs and discussed the procedure including the risks, benefits and alternatives for the proposed anesthesia with the patient or authorized representative who has indicated his/her understanding and acceptance.   Dental Advisory Given  Plan Discussed with:  Anesthesiologist, CRNA and Surgeon  Anesthesia Plan Comments: (Patient consented for risks of anesthesia including but not limited to:  - adverse reactions to medications - damage to teeth, lips or other oral mucosa - sore throat or hoarseness - Damage to heart, brain, lungs or loss of life  Patient voiced understanding.)        Anesthesia Quick Evaluation

## 2018-01-09 NOTE — Anesthesia Post-op Follow-up Note (Signed)
Anesthesia QCDR form completed.        

## 2018-01-09 NOTE — ED Triage Notes (Signed)
Sore throat increasing x 2 days, extreme pain with swallowing.

## 2018-01-09 NOTE — ED Triage Notes (Signed)
FIRST NURSE NOTE-c/o sore throat and "feels like glands are swollen". Handling secretions. NAD

## 2018-01-09 NOTE — Anesthesia Procedure Notes (Signed)
Procedure Name: Intubation Date/Time: 01/09/2018 9:58 PM Performed by: Nelda Marseille, CRNA Pre-anesthesia Checklist: Patient identified, Patient being monitored, Timeout performed, Emergency Drugs available and Suction available Patient Re-evaluated:Patient Re-evaluated prior to induction Oxygen Delivery Method: Circle system utilized Preoxygenation: Pre-oxygenation with 100% oxygen Induction Type: IV induction Ventilation: Mask ventilation without difficulty Laryngoscope Size: Mac, 3 and McGraph Grade View: Grade I Tube type: Oral Tube size: 6.5 mm Number of attempts: 1 Airway Equipment and Method: Stylet and Video-laryngoscopy Placement Confirmation: ETT inserted through vocal cords under direct vision,  positive ETCO2 and breath sounds checked- equal and bilateral Secured at: 21 cm Tube secured with: Tape Dental Injury: Teeth and Oropharynx as per pre-operative assessment  Difficulty Due To: Difficulty was anticipated Comments: McGrath used due to trismus

## 2018-01-09 NOTE — ED Notes (Signed)
Pt reports that she has swollen glands and is unable to open her mouth or talk due to sore throat - pt has been hurting since Saturday

## 2018-01-09 NOTE — ED Notes (Signed)
Pt in reporting 10/10 throat pain that began Saturday. Unable to provide pain descriptors.

## 2018-01-09 NOTE — Transfer of Care (Signed)
Immediate Anesthesia Transfer of Care Note  Patient: Cheyenne Barnes  Procedure(s) Performed: INCISION AND DRAINAGE OF PERITONSILLAR ABCESS (N/A )  Patient Location: PACU  Anesthesia Type:General  Level of Consciousness: awake, alert  and oriented  Airway & Oxygen Therapy: Patient Spontanous Breathing  Post-op Assessment: Report given to RN and Post -op Vital signs reviewed and stable  Post vital signs: Reviewed and stable  Last Vitals:  Vitals Value Taken Time  BP 118/73 01/09/2018 10:14 PM  Temp    Pulse 112 01/09/2018 10:14 PM  Resp 20 01/09/2018 10:14 PM  SpO2 100 % 01/09/2018 10:14 PM  Vitals shown include unvalidated device data.  Last Pain:  Vitals:   01/09/18 1929  TempSrc:   PainSc: 10-Worst pain ever         Complications: No apparent anesthesia complications

## 2018-01-09 NOTE — ED Provider Notes (Signed)
[facility] Emergency Department Provider Note  ____________________________________________  Time seen: Approximately 5:16 PM  I have reviewed the triage vital signs and the nursing notes.   HISTORY  Chief Complaint Sore Throat    HPI Cheyenne Barnes is a 22 y.o. female who presents the emergency department complaining of worsening sore throat.  Patient reports that she has had a sore throat for the past 2 days.  It is sharp, feeling like "swallowing glass."  Patient reports that pain is significantly gotten worse than the left.  She is having difficulty swallowing due to pain but not a frank difficulty of swallowing.  Patient does have voice changes.  Patient reports fever but denies nasal congestion, coughing.  No shortness of breath.  Patient is tried over-the-counter medications until it became too painful to swallow over-the-counter medications.  No relief from same.  Patient denies any other complaints at this time.  Patient has a history of ITP and thrombocytopenia.  Patient is routinely monitored with no recent indication of thrombocytopenia.  Past Medical History:  Diagnosis Date  . History of ITP   . 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 Thrombocytopenia (HCC) 02/19/2013    Patient Active Problem List   Diagnosis Date Noted  . Idiopathic thrombocytopenic purpura (HCC) 12/11/2014    History reviewed. No pertinent surgical history.  Prior to Admission medications   Medication Sig Start Date End Date Taking? Authorizing Provider  acetaminophen (TYLENOL) 325 MG tablet Take 325 mg by mouth every 6 (six) hours as needed for moderate pain.   Yes [provider]  Norgestimate-Ethinyl Estradiol Triphasic (TRINESSA, 28,) 0.18/0.215/0.25 MG-35 MCG tablet Take 1 tablet by mouth daily.    [provider]  predniSONE (DELTASONE) 10 MG tablet Take 3 tablets once a day for next 3 days Patient not taking: Reported on  01/09/2018 09/15/17   Tommi Rumps, PA-C  UNABLE TO FIND Med Name: Stackers    [provider]    Allergies Codeine and Sulfa antibiotics  History reviewed. No pertinent family history.  Social History Social History   Tobacco Use  . Smoking status: Never Smoker  . Smokeless tobacco: Never Used  Substance Use Topics  . Alcohol use: No  . Drug use: Yes    Types: Marijuana     Review of Systems  Constitutional: No fever/chills Eyes: No visual changes. No discharge ENT: For sharp sore throat, worse on the right than left. Cardiovascular: no chest pain. Respiratory: no cough. No SOB. Gastrointestinal: No abdominal pain.  No nausea, no vomiting.  No diarrhea.  No constipation. Genitourinary: Negative for dysuria. No hematuria Musculoskeletal: Negative for musculoskeletal pain. Skin: Negative for rash, abrasions, lacerations, ecchymosis. Neurological: Negative for headaches, focal weakness or numbness. 10-point ROS otherwise negative.  ____________________________________________   PHYSICAL EXAM:  VITAL SIGNS: ED Triage Vitals  Enc Vitals Group     BP 01/09/18 1655 (!) 146/98     Pulse Rate 01/09/18 1655 97     Resp 01/09/18 1655 20     Temp 01/09/18 1655 100.2 F (37.9 C)     Temp Source 01/09/18 1655 Oral     SpO2 01/09/18 1655 98 %     Weight 01/09/18 1655 186 lb (84.4 kg)     Height 01/09/18 1655  (1.6 m)     Head Circumference --      Peak Flow --      Pain Score 01/09/18 1657 10     Pain Loc --  Pain Edu? --      Excl. in GC? --      Constitutional: Alert and oriented. Well appearing and in no acute distress. Eyes: Conjunctivae are normal. PERRL. EOMI. Head: Atraumatic. ENT:      Ears: EACs and TMs unremarkable bilaterally      Nose: No congestion/rhinnorhea.      Mouth/Throat: Mucous membranes are moist.  Patient is unable to fully open her mouth due to pain, what is visualized reveals erythema and edema bilateral tonsils,  greater on right than left.  Mild uvular deviation is appreciated.  No visible foreign body. Neck: No stridor.  Is supple full range of motion Hematological/Lymphatic/Immunilogical: Diffuse, mobile, tender anterior cervical lymphadenopathy. Cardiovascular: Normal rate, regular rhythm. Normal S1 and S2.  Good peripheral circulation. Respiratory: Normal respiratory effort without tachypnea or retractions. Lungs CTAB. Good air entry to the bases with no decreased or absent breath sounds. Gastrointestinal: Bowel sounds 4 quadrants. Soft and nontender to palpation. No guarding or rigidity. No palpable hepatosplenomegaly.  No palpable masses.. No distention. No CVA tenderness. Musculoskeletal: Full range of motion to all extremities. No gross deformities appreciated. Neurologic:  Normal speech and language. No gross focal neurologic deficits are appreciated.  Skin:  Skin is warm, dry and intact. No rash noted. Psychiatric: Mood and affect are normal. Speech and behavior are normal. Patient exhibits appropriate insight and judgement.   ____________________________________________   LABS (all labs ordered are listed, but only abnormal results are displayed)  Labs Reviewed  COMPREHENSIVE METABOLIC PANEL - Abnormal; Notable for the following components:      Result Value   Glucose, Bld 113 (*)    All other components within normal limits  CBC WITH DIFFERENTIAL/PLATELET - Abnormal; Notable for the following components:   WBC 13.4 (*)    MCV 101.2 (*)    MCH 34.5 (*)    Neutro Abs 10.8 (*)    Monocytes Absolute 1.0 (*)    All other components within normal limits  GROUP A STREP BY PCR  MONONUCLEOSIS SCREEN  LACTIC ACID, PLASMA  LACTIC ACID, PLASMA   ____________________________________________  EKG   ____________________________________________  RADIOLOGY I, Delorise Royals Cuthriell, personally viewed and evaluated these images (plain radiographs) as part of my medical decision making, as  well as reviewing the written report by the radiologist.  I discussed the results with radiologist.  Patient has low-attenuation area in the right tonsil measuring 1 x 1.5 cm.  Ct Soft Tissue Neck W Contrast  Result Date: 01/09/2018 CLINICAL DATA:  Swollen glands, unable to open her mouth. EXAM: CT NECK WITH CONTRAST TECHNIQUE: Multidetector CT imaging of the neck was performed using the standard protocol following the bolus administration of intravenous contrast. CONTRAST:  75mL ISOVUE-300 IOPAMIDOL (ISOVUE-300) INJECTION 61% COMPARISON:  None. FINDINGS: Pharynx and larynx: Both tonsils are significantly enlarged. There is a low-attenuation collection within the RIGHT tonsil, approximately 8 x 14 x 15 mm, consistent with an intra tonsillar phlegmon or developing abscess. No definite peritonsillar inflammation. Normal larynx. Salivary glands: No inflammation, mass, or stone. Thyroid: Normal. Lymph nodes: Marked reactive lymphadenopathy most likely secondary to tonsillitis. Vascular: Negative. Limited intracranial: Negative. Visualized orbits: Negative. Mastoids and visualized paranasal sinuses: Clear. Skeleton: No acute or aggressive process. Upper chest: Negative. Other: None. IMPRESSION: Severe BILATERAL tonsillitis, with a developing RIGHT tonsillar abscess, roughly 1 x 1.5 cm. Reactive cervical adenopathy. No visible peritonsillar inflammation. Findings discussed with ordering provider. Electronically Signed   By: Elsie Stain M.D.   On: 01/09/2018  19:17    ____________________________________________    PROCEDURES  Procedure(s) performed:    Procedures    Medications  LORazepam (ATIVAN) injection 1 mg (has no administration in time range)  dexamethasone (DECADRON) injection 10 mg (10 mg Intravenous Given 01/09/18 1743)  iopamidol (ISOVUE-300) 61 % injection 75 mL (75 mLs Intravenous Contrast Given 01/09/18 1842)  Ampicillin-Sulbactam (UNASYN) 3 g in sodium chloride 0.9 % 100 mL IVPB (0 g  Intravenous Stopped 01/09/18 2056)  sodium chloride 0.9 % bolus 1,000 mL (1,000 mLs Intravenous New Bag/Given 01/09/18 2025)     ____________________________________________   INITIAL IMPRESSION / ASSESSMENT AND PLAN / ED COURSE  Pertinent labs & imaging results that were available during my care of the patient were reviewed by me and considered in my medical decision making (see chart for details).  Review of the [place] CSRS was performed in accordance of the [facility] prior to dispensing any controlled drugs.  Clinical Course as of Jan 09 2134  Mon Jan 09, 2018  1727 Patient presents with worsening sore throat, voice changes.  On exam, exam was limited somewhat due to patient's pain and inability to open mouth fully.  However, what was visualized reveals erythema and edema of the right tonsil being greater than left with mild uvular deviation.  Patient does have a raspy/full sounding voice.  Initial differential includes viral pharyngitis, strep throat, mononucleosis, peritonsillar abscess.  Given differential, patient will be evaluated with basic blood work, mono testing, strep testing, CT scan.  Initially, patient is given Decadron for symptom improvement.  IV antibiotics will be held pending results.   [JC]    Clinical Course User Index [JC] Cuthriell, Delorise Royals, PA-C    Patient's diagnosis is consistent with right tonsillar abscess.  Patient presents emergency department with increasing sore throat, reported difficulty swallowing.  Exam was concerning for possible peritonsillar abscess.  CT reveals one by one and half centimeter right tonsillar abscess.  Patient reports that she is having difficulty swallowing, having drank less than 8 ounces of fluid in the past 36 hours.  Based off the patient's symptoms, physical exam, CT findings, I discussed the patient with on-call ENT provider, Dr. Gershon Crane.  After he reviewed CT scan, it was decided that patient would benefit from incision and drainage  in the OR and admission for IV antibiotics.  Patient is informed and is agreeable to incision and drainage.  Patient care will be turned over to ENT provider, Dr. Gershon Crane.   ____________________________________________  FINAL CLINICAL IMPRESSION(S) / ED DIAGNOSES  Final diagnoses:  Peritonsillar abscess          This chart was dictated using voice recognition software/Dragon. Despite best efforts to proofread, errors can occur which can change the meaning. Any change was purely unintentional.    Racheal Patches, PA-C 01/09/18 2135    Minna Antis, MD 01/09/18 2332

## 2018-01-10 ENCOUNTER — Encounter: Payer: Self-pay | Admitting: Otolaryngology

## 2018-01-10 NOTE — Anesthesia Postprocedure Evaluation (Signed)
Anesthesia Post Note  Patient: Cheyenne Barnes  Procedure(s) Performed: INCISION AND DRAINAGE OF PERITONSILLAR ABCESS (N/A )  Patient location during evaluation: PACU Anesthesia Type: General Level of consciousness: awake and alert Pain management: pain level controlled Vital Signs Assessment: post-procedure vital signs reviewed and stable Respiratory status: spontaneous breathing, nonlabored ventilation, respiratory function stable and patient connected to nasal cannula oxygen Cardiovascular status: blood pressure returned to baseline and stable Postop Assessment: no apparent nausea or vomiting Anesthetic complications: no     Last Vitals:  Vitals:   01/09/18 2300 01/09/18 2310  BP: 134/88 130/89  Pulse: 85 80  Resp: 16 16  Temp: 37 C 36.9 C  SpO2: 97% 97%    Last Pain:  Vitals:   01/09/18 2310  TempSrc: Temporal  PainSc: 2                  Cleda Mccreedy Piscitello

## 2018-01-15 LAB — AEROBIC/ANAEROBIC CULTURE (SURGICAL/DEEP WOUND)

## 2018-01-15 LAB — AEROBIC/ANAEROBIC CULTURE W GRAM STAIN (SURGICAL/DEEP WOUND)

## 2018-01-31 NOTE — Op Note (Signed)
Preoperative Diagnosis- PTA  Postoperative Diagnosis- same  Complications- none  Specimens-none  Findings- right peritonsillar abscess  Anesthesia- general  Procedure- drainage of right PTA  Description- Informed consent was obtained. The risks and benefits were reviewed. The patient was brought to the operating room. She was positioned supine. Timeouts were performed. She was intubated without difficulty. The head of the bed was turned. A tonsil mouth gag was placed. The oropharynx was visualized. There was significant prominence, erythema or right peritonsillar region with leftward uvular deviation. An incision was made in the soft tissue overlying this region. Blunt dissection opened the plan in peritonsillar region. Purulence was quickly encountered and drained. There were no other concerning findings or lesions. Hemostasis was obtained. The space was irrigated. The patient was turned back to anesthesia and awoken without issue. There were no complications.

## 2018-03-26 ENCOUNTER — Other Ambulatory Visit: Payer: Self-pay

## 2018-03-26 ENCOUNTER — Emergency Department
Admission: EM | Admit: 2018-03-26 | Discharge: 2018-03-26 | Disposition: A | Discharge status: Home / Self Care | Payer: Medicaid Other | Attending: Emergency Medicine | Admitting: Emergency Medicine

## 2018-03-26 ENCOUNTER — Emergency Department: Payer: Medicaid Other

## 2018-03-26 ENCOUNTER — Encounter: Payer: Self-pay | Admitting: Emergency Medicine

## 2018-03-26 DIAGNOSIS — Y999 Unspecified external cause status: Secondary | ICD-10-CM | POA: Insufficient documentation

## 2018-03-26 DIAGNOSIS — Y929 Unspecified place or not applicable: Secondary | ICD-10-CM | POA: Insufficient documentation

## 2018-03-26 DIAGNOSIS — S80212A Abrasion, left knee, initial encounter: Secondary | ICD-10-CM

## 2018-03-26 DIAGNOSIS — Y9301 Activity, walking, marching and hiking: Secondary | ICD-10-CM | POA: Insufficient documentation

## 2018-03-26 DIAGNOSIS — W109XXA Fall (on) (from) unspecified stairs and steps, initial encounter: Secondary | ICD-10-CM | POA: Insufficient documentation

## 2018-03-26 DIAGNOSIS — S8002XA Contusion of left knee, initial encounter: Secondary | ICD-10-CM | POA: Insufficient documentation

## 2018-03-26 MED ORDER — CEPHALEXIN 500 MG PO CAPS: 1000.0000 mg | ORAL_CAPSULE | Freq: Two times a day (BID) | ORAL | 0 refills | Status: DC

## 2018-03-26 MED ORDER — MELOXICAM 15 MG PO TABS: 15.0000 mg | ORAL_TABLET | Freq: Every day | ORAL | 0 refills | Status: DC

## 2018-03-26 NOTE — ED Triage Notes (Signed)
Pt to ED via POV for left knee pain. Pt fell Thursday night and knee has been hurting since then. Pt is in NAD at this time.

## 2018-03-26 NOTE — ED Provider Notes (Signed)
[facility] Emergency Department Provider Note  ____________________________________________  Time seen: Approximately 7:12 PM  I have reviewed the triage vital signs and the nursing notes.   HISTORY  Chief Complaint Knee Pain    HPI Cheyenne Barnes is a 22 y.o. female who presents the emergency department complaining of 4 days of knee pain.  Patient reports that she was ascending steps, tripped missing 1 of the steps and landing on her left knee.  Patient reports that she sustained an abrasion to the knee, and has been having knee pain since.  She reports that she is able to bear weight but doing so increases the pain.  She reports that she has been walking with a limp.  No medications for this complaint prior to arrival.  No history of previous knee injury or surgeries to the left knee.  No other complaints at this time.  No medications for this complaint prior to arrival.    Past Medical History:  Diagnosis Date  . History of ITP   . 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 Thrombocytopenia (HCC) 02/19/2013    Patient Active Problem List   Diagnosis Date Noted  . Idiopathic thrombocytopenic purpura (HCC) 12/11/2014    Past Surgical History:  Procedure Laterality Date  . INCISION AND DRAINAGE OF PERITONSILLAR ABCESS N/A 01/09/2018   Procedure: INCISION AND DRAINAGE OF PERITONSILLAR ABCESS;  Surgeon: Tawni Carnes, MD;  Location: [facility] ORS;  Service: ENT;  Laterality: N/A;    Prior to Admission medications   Medication Sig Start Date End Date Taking? Authorizing Provider  cephALEXin (KEFLEX) 500 MG capsule Take 2 capsules (1,000 mg total) by mouth 2 (two) times daily. 03/26/18   Donnamarie Shankles, Delorise Royals, PA-C  meloxicam (MOBIC) 15 MG tablet Take 1 tablet (15 mg total) by mouth daily. 03/26/18   Joyceline Maiorino, Delorise Royals, PA-C  methylPREDNISolone (MEDROL DOSEPAK) 4 MG TBPK tablet Dispense quantity sufficient for dose pack and take as  instructed 01/09/18   Tawni Carnes, MD  Norgestimate-Ethinyl Estradiol Triphasic (TRINESSA, 28,) 0.18/0.215/0.25 MG-35 MCG tablet Take 1 tablet by mouth daily.    [provider]    Allergies Codeine and Sulfa antibiotics  No family history on file.  Social History Social History   Tobacco Use  . Smoking status: Never Smoker  . Smokeless tobacco: Never Used  Substance Use Topics  . Alcohol use: No  . Drug use: Yes    Types: Marijuana     Review of Systems  Constitutional: No fever/chills Eyes: No visual changes.  Cardiovascular: no chest pain. Respiratory: no cough. No SOB. Gastrointestinal: No abdominal pain.  No nausea, no vomiting.   Musculoskeletal: Positive for left knee injury Skin: Negative for rash, abrasions, lacerations, ecchymosis. Neurological: Negative for headaches, focal weakness or numbness. 10-point ROS otherwise negative.  ____________________________________________   PHYSICAL EXAM:  VITAL SIGNS: ED Triage Vitals  Enc Vitals Group     BP 03/26/18 1821 126/80     Pulse Rate 03/26/18 1821 84     Resp 03/26/18 1821 16     Temp 03/26/18 1821 99 F (37.2 C)     Temp Source 03/26/18 1821 Oral     SpO2 03/26/18 1821 100 %     Weight 03/26/18 1821 191 lb (86.6 kg)     Height 03/26/18 1821 5\' 3"  (1.6 m)     Head Circumference --      Peak Flow --      Pain Score 03/26/18 1820 7  Pain Loc --      Pain Edu? --      Excl. in GC? --      Constitutional: Alert and oriented. Well appearing and in no acute distress. Eyes: Conjunctivae are normal. PERRL. EOMI. Head: Atraumatic. Neck: No stridor.    Cardiovascular: Normal rate, regular rhythm. Normal S1 and S2.  Good peripheral circulation. Respiratory: Normal respiratory effort without tachypnea or retractions. Lungs CTAB. Good air entry to the bases with no decreased or absent breath sounds. Musculoskeletal: Full range of motion to all extremities. No gross deformities  appreciated.  Patient has an abrasion to the left knee.  Patient does have some minimal yellow/pustular drainage from site.  No visible foreign body.  Joint is not erythematous.  No gross edema, deformity of the knee appreciated.  Patient is full range of motion to the left knee.  She is able to ambulate on same.  Patient has tenderness to palpation over the patella with no other tenderness to palpation.  No palpable abnormality or crepitus.  Varus, valgus, Lachman's, McMurray's is negative.  Dorsalis pedis pulse intact distally.  Sensation intact distally. Neurologic:  Normal speech and language. No gross focal neurologic deficits are appreciated.  Skin:  Skin is warm, dry and intact. No rash noted. Psychiatric: Mood and affect are normal. Speech and behavior are normal. Patient exhibits appropriate insight and judgement.   ____________________________________________   LABS (all labs ordered are listed, but only abnormal results are displayed)  Labs Reviewed - No data to display ____________________________________________  EKG   ____________________________________________  RADIOLOGY   Dg Knee Complete 4 Views Left  Result Date: 03/26/2018 CLINICAL DATA:  Status post fall 3 days ago with left knee pain. EXAM: LEFT KNEE - COMPLETE 4+ VIEW COMPARISON:  None. FINDINGS: No evidence of fracture, dislocation, or joint effusion. No evidence of arthropathy or other focal bone abnormality. Soft tissues are unremarkable. IMPRESSION: Negative. Electronically Signed   By: Sherian Rein M.D.   On: 03/26/2018 19:32    ____________________________________________    PROCEDURES  Procedure(s) performed:    Procedures    Medications - No data to display   ____________________________________________   INITIAL IMPRESSION / ASSESSMENT AND PLAN / ED COURSE  Pertinent labs & imaging results that were available during my care of the patient were reviewed by me and considered in my medical  decision making (see chart for details).  Review of the [place] CSRS was performed in accordance of the [facility] prior to dispensing any controlled drugs.      Patient's diagnosis is consistent with left knee effusion and abrasion.  Patient presents the emergency department complaining of left knee pain.  Patient has an abrasion to the left knee.  Mild drainage from site.  No gross indication of infection/abscess/septic joint.  Patient will be covered with antibiotics prophylactically.  X-ray reveals no acute osseous abnormality.  No evidence of acute ligamentous injury.  Patient will be placed on meloxicam for symptom improvement of knee pain.  Patient will follow-up with orthopedics as necessary. Patient is given ED precautions to return to the ED for any worsening or new symptoms.     ____________________________________________  FINAL CLINICAL IMPRESSION(S) / ED DIAGNOSES  Final diagnoses:  Contusion of left knee, initial encounter  Abrasion of left knee, initial encounter      NEW MEDICATIONS STARTED DURING THIS VISIT:  ED Discharge Orders         Ordered    meloxicam (MOBIC) 15 MG tablet  Daily  03/26/18 1943    cephALEXin (KEFLEX) 500 MG capsule  2 times daily     03/26/18 1943              This chart was dictated using voice recognition software/Dragon. Despite best efforts to proofread, errors can occur which can change the meaning. Any change was purely unintentional.    Lanette HampshireCuthriell, Jazariah Teall D, PA-C 03/26/18 2011    Dionne BucySiadecki, Sebastian, MD 03/26/18 2209

## 2018-04-12 DIAGNOSIS — E669 Obesity, unspecified: Secondary | ICD-10-CM | POA: Insufficient documentation

## 2018-04-12 DIAGNOSIS — Z62810 Personal history of physical and sexual abuse in childhood: Secondary | ICD-10-CM | POA: Insufficient documentation

## 2018-04-16 LAB — HM PAP SMEAR: HM PAP: NEGATIVE

## 2018-05-23 LAB — HM HIV SCREENING LAB: HM HIV SCREENING: NEGATIVE

## 2018-07-23 ENCOUNTER — Other Ambulatory Visit: Payer: Self-pay

## 2018-07-23 ENCOUNTER — Encounter: Payer: Self-pay | Admitting: Emergency Medicine

## 2018-07-23 ENCOUNTER — Emergency Department
Admission: EM | Admit: 2018-07-23 | Discharge: 2018-07-23 | Disposition: A | Discharge status: Home / Self Care | Payer: Medicaid Other | Attending: Emergency Medicine | Admitting: Emergency Medicine

## 2018-07-23 ENCOUNTER — Emergency Department: Payer: Medicaid Other

## 2018-07-23 DIAGNOSIS — J0391 Acute recurrent tonsillitis, unspecified: Secondary | ICD-10-CM | POA: Insufficient documentation

## 2018-07-23 DIAGNOSIS — Z79899 Other long term (current) drug therapy: Secondary | ICD-10-CM | POA: Insufficient documentation

## 2018-07-23 LAB — COMPREHENSIVE METABOLIC PANEL
ALT: 13 U/L (ref 0–44)
ANION GAP: 7 (ref 5–15)
AST: 19 U/L (ref 15–41)
Albumin: 4.2 g/dL (ref 3.5–5.0)
Alkaline Phosphatase: 89 U/L (ref 38–126)
BUN: 10 mg/dL (ref 6–20)
CHLORIDE: 103 mmol/L (ref 98–111)
CO2: 28 mmol/L (ref 22–32)
Calcium: 9.2 mg/dL (ref 8.9–10.3)
Creatinine, Ser: 0.48 mg/dL (ref 0.44–1.00)
GFR calc Af Amer: >60 mL/min (ref >60)
GFR calc non Af Amer: >60 mL/min (ref >60)
GLUCOSE: 92 mg/dL (ref 70–99)
POTASSIUM: 4 mmol/L (ref 3.5–5.1)
Sodium: 138 mmol/L (ref 135–145)
Total Bilirubin: 1.3 mg/dL — ABNORMAL HIGH (ref 0.3–1.2)
Total Protein: 7.5 g/dL (ref 6.5–8.1)

## 2018-07-23 LAB — GROUP A STREP BY PCR: GROUP A STREP BY PCR: NOT DETECTED

## 2018-07-23 LAB — CBC WITH DIFFERENTIAL/PLATELET
ABS IMMATURE GRANULOCYTES: 0.06 10*3/uL (ref 0.00–0.07)
BASOS PCT: 0 %
Basophils Absolute: 0.1 10*3/uL (ref 0.0–0.1)
EOS ABS: 0 10*3/uL (ref 0.0–0.5)
EOS PCT: 0 %
HCT: 42.3 % (ref 36.0–46.0)
Hemoglobin: 14 g/dL (ref 12.0–15.0)
Immature Granulocytes: 1 %
Lymphocytes Relative: 19 %
Lymphs Abs: 2.2 10*3/uL (ref 0.7–4.0)
MCH: 33.2 pg (ref 26.0–34.0)
MCHC: 33.1 g/dL (ref 30.0–36.0)
MCV: 100.2 fL — AB (ref 80.0–100.0)
MONO ABS: 0.9 10*3/uL (ref 0.1–1.0)
MONOS PCT: 8 %
NEUTROS ABS: 8.3 10*3/uL — AB (ref 1.7–7.7)
Neutrophils Relative %: 72 %
PLATELETS: 235 10*3/uL (ref 150–400)
RBC: 4.22 MIL/uL (ref 3.87–5.11)
RDW: 13.6 % (ref 11.5–15.5)
WBC: 11.5 10*3/uL — ABNORMAL HIGH (ref 4.0–10.5)
nRBC: 0 % (ref 0.0–0.2)

## 2018-07-23 LAB — MONONUCLEOSIS SCREEN: MONO SCREEN: NEGATIVE

## 2018-07-23 MED ORDER — SODIUM CHLORIDE 0.9 % IV SOLN
3.0000 g | Freq: Once | INTRAVENOUS | Status: AC
  Administered 2018-07-23: 3 g via INTRAVENOUS
  Filled 2018-07-23: qty 3

## 2018-07-23 MED ORDER — IOHEXOL 300 MG/ML  SOLN
75.0000 mL | Freq: Once | INTRAMUSCULAR | Status: AC | PRN
  Administered 2018-07-23: 75 mL via INTRAVENOUS
  Filled 2018-07-23: qty 75

## 2018-07-23 MED ORDER — METHYLPREDNISOLONE SODIUM SUCC 125 MG IJ SOLR
125.0000 mg | Freq: Once | INTRAMUSCULAR | Status: DC
  Filled 2018-07-23: qty 2

## 2018-07-23 MED ORDER — NAPROXEN 500 MG PO TABS: 500.0000 mg | ORAL_TABLET | Freq: Two times a day (BID) | ORAL | 0 refills | Status: DC

## 2018-07-23 MED ORDER — AMOXICILLIN-POT CLAVULANATE 875-125 MG PO TABS: 1.0000 | ORAL_TABLET | Freq: Two times a day (BID) | ORAL | 0 refills | Status: DC

## 2018-07-23 MED ORDER — METHYLPREDNISOLONE SODIUM SUCC 125 MG IJ SOLR
125.0000 mg | Freq: Once | INTRAMUSCULAR | Status: AC
  Administered 2018-07-23: 125 mg via INTRAVENOUS

## 2018-07-23 NOTE — ED Notes (Signed)
Pt with right throat swollen, red, painful. Denies breathing difficulty. Neck glans swollen.

## 2018-07-23 NOTE — ED Triage Notes (Addendum)
Patient reports pain on right side of throat only. Patient states history of peritonsillar abscess on the other side with same symptoms. Also reports fever at home. Patient reports severe pain with swallowing and swelling to right side of throat. Patient speaking in complete sentences and controlling secretions in triage.

## 2018-07-23 NOTE — Discharge Instructions (Signed)
Please call and schedule an appointment with ENT.  Return to the ER for any symptom that gets worse.  Take all of the antibiotic even if you feel better.

## 2018-07-23 NOTE — ED Provider Notes (Signed)
[facility] Emergency Department Provider Note  ____________________________________________  Time seen: Approximately 1:37 PM  I have reviewed the triage vital signs and the nursing notes.   HISTORY  Chief Complaint Sore Throat    HPI Cheyenne Barnes is a 22 y.o. female who presents to the emergency department for treatment and evaluation of sore throat. Symptoms started 2 days ago. She has a history of peritonsillar abscess on the left.  She has no pain on the left side.  She reports a subjective fever yesterday.  She is able to swallow but states that it hurts very bad.  No alleviating measures attempted prior to arrival.   Past Medical History:  Diagnosis Date  . History of ITP   . 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 Thrombocytopenia (HCC) 02/19/2013    Patient Active Problem List   Diagnosis Date Noted  . Idiopathic thrombocytopenic purpura (HCC) 12/11/2014    Past Surgical History:  Procedure Laterality Date  . INCISION AND DRAINAGE OF PERITONSILLAR ABCESS N/A 01/09/2018   Procedure: INCISION AND DRAINAGE OF PERITONSILLAR ABCESS;  Surgeon: Tawni Carnes, MD;  Location: [facility] ORS;  Service: ENT;  Laterality: N/A;    Prior to Admission medications   Medication Sig Start Date End Date Taking? Authorizing Provider  amoxicillin-clavulanate (AUGMENTIN) 875-125 MG tablet Take 1 tablet by mouth 2 (two) times daily. 07/23/18   Leslieanne Cobarrubias B, FNP  cephALEXin (KEFLEX) 500 MG capsule Take 2 capsules (1,000 mg total) by mouth 2 (two) times daily. 03/26/18   Cuthriell, Delorise Royals, PA-C  meloxicam (MOBIC) 15 MG tablet Take 1 tablet (15 mg total) by mouth daily. 03/26/18   Cuthriell, Delorise Royals, PA-C  methylPREDNISolone (MEDROL DOSEPAK) 4 MG TBPK tablet Dispense quantity sufficient for dose pack and take as instructed 01/09/18   Tawni Carnes, MD  naproxen (NAPROSYN) 500 MG tablet Take 1 tablet (500 mg total) by mouth 2 (two)  times daily with a meal. 07/23/18   Ahana Najera B, FNP  Norgestimate-Ethinyl Estradiol Triphasic (TRINESSA, 28,) 0.18/0.215/0.25 MG-35 MCG tablet Take 1 tablet by mouth daily.    [provider]    Allergies Codeine and Sulfa antibiotics  No family history on file.  Social History Social History   Tobacco Use  . Smoking status: Never Smoker  . Smokeless tobacco: Never Used  Substance Use Topics  . Alcohol use: No  . Drug use: Yes    Types: Marijuana    Review of Systems Constitutional: Positive for fever. Eyes: No visual changes. ENT: Positive for sore throat; negative for difficulty swallowing. Respiratory: Denies shortness of breath. Gastrointestinal: Negative for abdominal pain.  No nausea, no vomiting.  No diarrhea.  Genitourinary: Negative for dysuria.  Negative for decrease in need to void. Musculoskeletal: Negative for generalized body aches. Skin: Negative for rash. Neurological: Negative for headaches, negative for focal weakness or numbness.  ____________________________________________   PHYSICAL EXAM:  VITAL SIGNS: ED Triage Vitals  Enc Vitals Group     BP 07/23/18 1157 124/74     Pulse Rate 07/23/18 1157 74     Resp 07/23/18 1157 18     Temp 07/23/18 1157 98.8 F (37.1 C)     Temp Source 07/23/18 1157 Oral     SpO2 07/23/18 1157 100 %     Weight 07/23/18 1158 170 lb (77.1 kg)     Height 07/23/18 1158 5\' 3"  (1.6 m)     Head Circumference --      Peak Flow --  Pain Score 07/23/18 1157 8     Pain Loc --      Pain Edu? --      Excl. in GC? --     Constitutional: Alert and oriented. Well appearing and in no acute distress. Eyes: Conjunctivae are normal.  Head: Atraumatic. Nose: No congestion/rhinnorhea. Mouth/Throat: Mucous membranes are moist.  Oropharynx extremely erythematous, right tonsil 3+ without exudate.  Left tonsil is mildly edematous and erythematous uvula is midline. Ears: Right tympanic membrane appears normal.  Left  tympanic membrane appears normal. Neck: No stridor. Voice normal, not muffled. Lymphatic: Anterior cervical nodes tender and palpable Cardiovascular: Normal rate, regular rhythm. Good peripheral circulation. Respiratory: Normal respiratory effort. Lungs CTAB. Gastrointestinal: Soft and nontender. Musculoskeletal: FROM of neck, upper and lower extremities. Neurologic:  Normal speech and language. No gross focal neurologic deficits are appreciated. Skin:  Skin is warm, dry and intact.  No rash noted Psychiatric: Mood and affect are normal. Speech and behavior are normal.  ____________________________________________   LABS (all labs ordered are listed, but only abnormal results are displayed)  Labs Reviewed  CBC WITH DIFFERENTIAL/PLATELET - Abnormal; Notable for the following components:      Result Value   WBC 11.5 (*)    MCV 100.2 (*)    Neutro Abs 8.3 (*)    All other components within normal limits  COMPREHENSIVE METABOLIC PANEL - Abnormal; Notable for the following components:   Total Bilirubin 1.3 (*)    All other components within normal limits  GROUP A STREP BY PCR  MONONUCLEOSIS SCREEN   ____________________________________________  EKG  Not indiated ____________________________________________  RADIOLOGY  CT soft tissue neck with contrast shows tonsillitis without any fluid collection/abscess per radiology. ____________________________________________   PROCEDURES  Procedure(s) performed: None  Critical Care performed: No ____________________________________________   INITIAL IMPRESSION / ASSESSMENT AND PLAN / ED COURSE  22 year old female presenting to the emergency department for treatment and evaluation of sore throat on the right side.  Symptoms and exam are concerning for a recurrence of peritonsillar abscess.  She is maintaining her secretions and speaks in a normal, non-muffled voice.  CT soft tissue neck with contrast has been ordered as well as  labs.  This could also be mono, but the lymph nodes are not as reactive as would be expected. A Monospot has been requested.  While here, the patient was given Unasyn via IV.  She will be given a prescription for Augmentin and discharged home.  CT findings are reassuring.  She was also given a referral to see the ENT specialist.  She was encouraged to schedule follow-up appointment.  She is to return to the emergency department immediately if she begins to feel that her throat is closing or she notices that her voice is changing and or she is unable to swallow her saliva.  Patient verbalized understanding and agreed to the treatment plan.  Pertinent labs & imaging results that were available during my care of the patient were reviewed by me and considered in my medical decision making (see chart for details). ____________________________________________  Discharge Medication List as of 07/23/2018  3:35 PM    START taking these medications   Details  amoxicillin-clavulanate (AUGMENTIN) 875-125 MG tablet Take 1 tablet by mouth 2 (two) times daily., Starting Sun 07/23/2018, Print    naproxen (NAPROSYN) 500 MG tablet Take 1 tablet (500 mg total) by mouth 2 (two) times daily with a meal., Starting Sun 07/23/2018, Print        FINAL CLINICAL  IMPRESSION(S) / ED DIAGNOSES  Final diagnoses:  Acute recurrent tonsillitis    If controlled substance prescribed during this visit, 12 month history viewed on the [facility] prior to issuing an initial prescription for Schedule II or III opiod.   Note:  This document was prepared using Dragon voice recognition software and may include unintentional dictation errors.    Chinita Pester, FNP 07/23/18 1611    Governor Rooks, MD 07/25/18 [phone]

## 2018-09-20 ENCOUNTER — Other Ambulatory Visit: Payer: Self-pay

## 2018-09-20 ENCOUNTER — Emergency Department
Admission: EM | Admit: 2018-09-20 | Discharge: 2018-09-20 | Disposition: A | Discharge status: Home / Self Care | Payer: Medicaid Other | Attending: Emergency Medicine | Admitting: Emergency Medicine

## 2018-09-20 DIAGNOSIS — Z23 Encounter for immunization: Secondary | ICD-10-CM | POA: Insufficient documentation

## 2018-09-20 DIAGNOSIS — Y929 Unspecified place or not applicable: Secondary | ICD-10-CM | POA: Insufficient documentation

## 2018-09-20 DIAGNOSIS — J069 Acute upper respiratory infection, unspecified: Secondary | ICD-10-CM | POA: Insufficient documentation

## 2018-09-20 DIAGNOSIS — L089 Local infection of the skin and subcutaneous tissue, unspecified: Secondary | ICD-10-CM

## 2018-09-20 DIAGNOSIS — Y999 Unspecified external cause status: Secondary | ICD-10-CM | POA: Insufficient documentation

## 2018-09-20 DIAGNOSIS — Y939 Activity, unspecified: Secondary | ICD-10-CM | POA: Insufficient documentation

## 2018-09-20 DIAGNOSIS — Z79899 Other long term (current) drug therapy: Secondary | ICD-10-CM | POA: Insufficient documentation

## 2018-09-20 DIAGNOSIS — W260XXA Contact with knife, initial encounter: Secondary | ICD-10-CM | POA: Insufficient documentation

## 2018-09-20 DIAGNOSIS — S61212A Laceration without foreign body of right middle finger without damage to nail, initial encounter: Secondary | ICD-10-CM | POA: Insufficient documentation

## 2018-09-20 MED ORDER — TETANUS-DIPHTH-ACELL PERTUSSIS 5-2.5-18.5 LF-MCG/0.5 IM SUSP
0.5000 mL | Freq: Once | INTRAMUSCULAR | Status: AC
  Administered 2018-09-20: 0.5 mL via INTRAMUSCULAR
  Filled 2018-09-20: qty 0.5

## 2018-09-20 MED ORDER — PSEUDOEPH-BROMPHEN-DM 30-2-10 MG/5ML PO SYRP: 5.0000 mL | ORAL_SOLUTION | Freq: Four times a day (QID) | ORAL | 0 refills | Status: DC | PRN

## 2018-09-20 MED ORDER — CEPHALEXIN 500 MG PO CAPS: 500.0000 mg | ORAL_CAPSULE | Freq: Three times a day (TID) | ORAL | 0 refills | Status: DC

## 2018-09-20 NOTE — ED Notes (Signed)
Patient cut finger 3 days ago on knife. Redness noted to finger. No bleeding or discharge noted.

## 2018-09-20 NOTE — Discharge Instructions (Signed)
Follow-up with your primary care provider or can no clinic acute care if any continued problems.  Begin taking antibiotics as directed until completely finished.  Also soak your finger in warm water or use warm moist compresses to your finger frequently.  Return to the emergency department immediately if any severe worsening of your finger infection or if you see red streaks going up your finger.  The Bromfed-DM is for cough and congestion.  Increase fluids.  Tylenol if needed for body aches or fever.

## 2018-09-20 NOTE — ED Provider Notes (Signed)
[facility] Emergency Department Provider Note   ____________________________________________   First MD Initiated Contact with Patient 09/20/18 1413     (approximate)  I have reviewed the triage vital signs and the nursing notes.   HISTORY  Chief Complaint Finger Injury    HPI Cheyenne Barnes is a 23 y.o. female presents to the ED with complaint of cut to her right third finger 3 days ago on a knife.  Patient states that area has become very tender to touch.  She also believes that her last tetanus was over 5 years ago.    Past Medical History:  Diagnosis Date  . History of ITP   . 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 Thrombocytopenia (HCC) 02/19/2013    Patient Active Problem List   Diagnosis Date Noted  . Idiopathic thrombocytopenic purpura (HCC) 12/11/2014    Past Surgical History:  Procedure Laterality Date  . INCISION AND DRAINAGE OF PERITONSILLAR ABCESS N/A 01/09/2018   Procedure: INCISION AND DRAINAGE OF PERITONSILLAR ABCESS;  Surgeon: Tawni Carnes'Connell, Brendan P, MD;  Location: [facility] ORS;  Service: ENT;  Laterality: N/A;    Prior to Admission medications   Medication Sig Start Date End Date Taking? Authorizing Provider  brompheniramine-pseudoephedrine-DM 30-2-10 MG/5ML syrup Take 5 mLs by mouth 4 (four) times daily as needed. 09/20/18   Tommi RumpsSummers, Eliyah Bazzi L, PA-C  cephALEXin (KEFLEX) 500 MG capsule Take 1 capsule (500 mg total) by mouth 3 (three) times daily. 09/20/18   Tommi RumpsSummers, Xzayvion Vaeth L, PA-C  Norgestimate-Ethinyl Estradiol Triphasic (TRINESSA, 28,) 0.18/0.215/0.25 MG-35 MCG tablet Take 1 tablet by mouth daily.    [provider]    Allergies Codeine and Sulfa antibiotics  History reviewed. No pertinent family history.  Social History Social History   Tobacco Use  . Smoking status: Never Smoker  . Smokeless tobacco: Never Used  Substance Use Topics  . Alcohol use: Yes  . Drug use: Yes    Types: Marijuana     Review of Systems Constitutional: No fever/chills Cardiovascular: Denies chest pain. Respiratory: Denies shortness of breath. Musculoskeletal: Negative for back pain. Skin: Positive for erythema and possible skin infection finger. Neurological: Negative for headaches, focal weakness or numbness. ___________________________________________   PHYSICAL EXAM:  VITAL SIGNS: ED Triage Vitals  Enc Vitals Group     BP 09/20/18 1322 (!) 124/108     Pulse Rate 09/20/18 1322 99     Resp 09/20/18 1322 20     Temp 09/20/18 1322 98.6 F (37 C)     Temp Source 09/20/18 1322 Oral     SpO2 09/20/18 1322 100 %     Weight 09/20/18 1323 179 lb (81.2 kg)     Height 09/20/18 1323 5\' 3"  (1.6 m)     Head Circumference --      Peak Flow --      Pain Score 09/20/18 1323 8     Pain Loc --      Pain Edu? --      Excl. in GC? --    Constitutional: Alert and oriented. Well appearing and in no acute distress. Eyes: Conjunctivae are normal.  Head: Atraumatic. HENT: Nasal congestion.  Oral mucosa moist, nonerythematous. Neck: No stridor.   Cardiovascular: Normal rate, regular rhythm. Grossly normal heart sounds.  Good peripheral circulation. Respiratory: Normal respiratory effort.  No retractions. Lungs CTAB. Musculoskeletal: Right third finger patient is able to flex and extend without any difficulty.  Sensory function intact. Neurologic:  Normal speech and language. No gross  focal neurologic deficits are appreciated. No gait instability. Skin:  Skin is warm, dry.  There is a 1 cm healing superficial laceration at the tip of the right third finger without bleeding or drainage.  Area surrounding is with mild erythema.  There is some minimal soft tissue edema present that is tender as well. Psychiatric: Mood and affect are normal. Speech and behavior are normal.  ____________________________________________   LABS (all labs ordered are listed, but only abnormal results are displayed)  Labs  Reviewed - No data to display  PROCEDURES  Procedure(s) performed: None  Procedures  Critical Care performed: No  ____________________________________________   INITIAL IMPRESSION / ASSESSMENT AND PLAN / ED COURSE  As part of my medical decision making, I reviewed the following data within the electronic MEDICAL RECORD NUMBER Notes from prior ED visits and [place] Controlled Substance Database  Patient presents with history of cut to her right third finger 3 days ago that has become red and tender.  There is been no drainage from the area.  Patient states that her tetanus is not up-to-date.  She also has had an upper respiratory illness with cough and congestion and requested a cough medication.  Examination is concerning for a localized infection at the laceration site but skin edges are approximated and appear to be healing well.  Patient was placed on Keflex 500 mg 3 times daily for 7 days.  She is also given a prescription for Bromfed-DM as needed for cough and congestion.  ____________________________________________   FINAL CLINICAL IMPRESSION(S) / ED DIAGNOSES  Final diagnoses:  Laceration of right middle finger without foreign body without damage to nail, initial encounter  Skin infection  Acute URI     ED Discharge Orders         Ordered    cephALEXin (KEFLEX) 500 MG capsule  3 times daily     09/20/18 1515    brompheniramine-pseudoephedrine-DM 30-2-10 MG/5ML syrup  4 times daily PRN     09/20/18 1515           Note:  This document was prepared using Dragon voice recognition software and may include unintentional dictation errors.    Tommi Rumps, PA-C 09/20/18 1604    Nita Sickle, MD 09/22/18 2227

## 2018-09-20 NOTE — ED Triage Notes (Signed)
Pt cut R middle finger on a knife at nail 2-3 days ago. tetanus NOT UTD. No bleeding at this time.

## 2018-09-22 DIAGNOSIS — Z6833 Body mass index (BMI) 33.0-33.9, adult: Secondary | ICD-10-CM

## 2018-09-22 DIAGNOSIS — Z62810 Personal history of physical and sexual abuse in childhood: Secondary | ICD-10-CM

## 2018-09-22 DIAGNOSIS — E669 Obesity, unspecified: Secondary | ICD-10-CM

## 2019-03-19 ENCOUNTER — Encounter: Payer: Self-pay | Admitting: Emergency Medicine

## 2019-03-19 ENCOUNTER — Other Ambulatory Visit: Payer: Self-pay

## 2019-03-19 ENCOUNTER — Emergency Department
Admission: EM | Admit: 2019-03-19 | Discharge: 2019-03-19 | Disposition: A | Discharge status: Home / Self Care | Payer: Self-pay | Attending: Emergency Medicine | Admitting: Emergency Medicine

## 2019-03-19 ENCOUNTER — Emergency Department: Payer: Self-pay

## 2019-03-19 DIAGNOSIS — D696 Thrombocytopenia, unspecified: Secondary | ICD-10-CM | POA: Insufficient documentation

## 2019-03-19 DIAGNOSIS — J039 Acute tonsillitis, unspecified: Secondary | ICD-10-CM | POA: Insufficient documentation

## 2019-03-19 LAB — BASIC METABOLIC PANEL
Anion gap: 11 (ref 5–15)
BUN: 10 mg/dL (ref 6–20)
CO2: 21 mmol/L — ABNORMAL LOW (ref 22–32)
Calcium: 9.1 mg/dL (ref 8.9–10.3)
Chloride: 104 mmol/L (ref 98–111)
Creatinine, Ser: 0.75 mg/dL (ref 0.44–1.00)
GFR calc Af Amer: >60 mL/min (ref >60)
GFR calc non Af Amer: >60 mL/min (ref >60)
Glucose, Bld: 111 mg/dL — ABNORMAL HIGH (ref 70–99)
Potassium: 3.8 mmol/L (ref 3.5–5.1)
Sodium: 136 mmol/L (ref 135–145)

## 2019-03-19 LAB — CBC WITH DIFFERENTIAL/PLATELET
Abs Immature Granulocytes: 0.05 10*3/uL (ref 0.00–0.07)
Basophils Absolute: 0.1 10*3/uL (ref 0.0–0.1)
Basophils Relative: 0 %
Eosinophils Absolute: 0 10*3/uL (ref 0.0–0.5)
Eosinophils Relative: 0 %
HCT: 42.6 % (ref 36.0–46.0)
Hemoglobin: 14.3 g/dL (ref 12.0–15.0)
Immature Granulocytes: 0 %
Lymphocytes Relative: 7 %
Lymphs Abs: 1.1 10*3/uL (ref 0.7–4.0)
MCH: 34 pg (ref 26.0–34.0)
MCHC: 33.6 g/dL (ref 30.0–36.0)
MCV: 101.4 fL — ABNORMAL HIGH (ref 80.0–100.0)
Monocytes Absolute: 0.8 10*3/uL (ref 0.1–1.0)
Monocytes Relative: 5 %
Neutro Abs: 12.9 10*3/uL — ABNORMAL HIGH (ref 1.7–7.7)
Neutrophils Relative %: 88 %
Platelets: 221 10*3/uL (ref 150–400)
RBC: 4.2 MIL/uL (ref 3.87–5.11)
RDW: 12.9 % (ref 11.5–15.5)
WBC: 14.8 10*3/uL — ABNORMAL HIGH (ref 4.0–10.5)
nRBC: 0 % (ref 0.0–0.2)

## 2019-03-19 LAB — MONONUCLEOSIS SCREEN: Mono Screen: NEGATIVE

## 2019-03-19 LAB — GROUP A STREP BY PCR: Group A Strep by PCR: NOT DETECTED

## 2019-03-19 MED ORDER — METHYLPREDNISOLONE 4 MG PO TBPK: ORAL_TABLET | ORAL | 0 refills | Status: DC

## 2019-03-19 MED ORDER — AMOXICILLIN-POT CLAVULANATE 875-125 MG PO TABS: 1.0000 | ORAL_TABLET | Freq: Two times a day (BID) | ORAL | 0 refills | Status: AC

## 2019-03-19 MED ORDER — DEXAMETHASONE SODIUM PHOSPHATE 10 MG/ML IJ SOLN
10.0000 mg | Freq: Once | INTRAMUSCULAR | Status: AC
  Administered 2019-03-19: 10 mg via INTRAVENOUS
  Filled 2019-03-19: qty 1

## 2019-03-19 MED ORDER — SODIUM CHLORIDE 0.9 % IV SOLN
1.0000 g | Freq: Once | INTRAVENOUS | Status: AC
  Administered 2019-03-19: 1 g via INTRAVENOUS
  Filled 2019-03-19: qty 10

## 2019-03-19 MED ORDER — IOHEXOL 300 MG/ML  SOLN
75.0000 mL | Freq: Once | INTRAMUSCULAR | Status: AC | PRN
  Administered 2019-03-19: 75 mL via INTRAVENOUS
  Filled 2019-03-19: qty 75

## 2019-03-19 MED ORDER — LIDOCAINE VISCOUS HCL 2 % MT SOLN: 5.0000 mL | Freq: Four times a day (QID) | OROMUCOSAL | 0 refills | Status: DC | PRN

## 2019-03-19 NOTE — ED Triage Notes (Signed)
Pt reports sore throat since yesterday and a fever last pm.
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
## 2019-03-19 NOTE — ED Notes (Signed)
See triage note.  States she woke up with sore throat and subjective fever  States she woke up in a sweat but is afebrile on arrival

## 2019-04-06 ENCOUNTER — Other Ambulatory Visit: Payer: Self-pay

## 2019-04-06 ENCOUNTER — Other Ambulatory Visit
Admission: RE | Admit: 2019-04-06 | Discharge: 2019-04-06 | Disposition: A | Discharge status: Home / Self Care | Payer: HRSA Program | Source: Ambulatory Visit | Attending: Otolaryngology | Admitting: Otolaryngology

## 2019-04-06 DIAGNOSIS — Z20828 Contact with and (suspected) exposure to other viral communicable diseases: Secondary | ICD-10-CM | POA: Diagnosis not present

## 2019-04-06 DIAGNOSIS — Z01812 Encounter for preprocedural laboratory examination: Secondary | ICD-10-CM | POA: Insufficient documentation

## 2019-04-06 LAB — SARS CORONAVIRUS 2 (TAT 6-24 HRS): SARS Coronavirus 2: NEGATIVE

## 2019-04-10 ENCOUNTER — Other Ambulatory Visit: Payer: Self-pay

## 2019-04-10 ENCOUNTER — Encounter
Admission: RE | Admit: 2019-04-10 | Discharge: 2019-04-10 | Disposition: A | Discharge status: Home / Self Care | Payer: Medicaid Other | Source: Ambulatory Visit | Attending: Otolaryngology | Admitting: Otolaryngology

## 2019-04-10 HISTORY — DX: Family history of other specified conditions: Z84.89

## 2019-04-10 NOTE — Patient Instructions (Signed)
Your procedure is scheduled [id number] Report to Day Surgery.at 6:00   Remember: Instructions that are not followed completely may result in serious medical risk,  up to and including death, or upon the discretion of your surgeon and anesthesiologist your  surgery may need to be rescheduled.     _X__ 1. Do not eat food after midnight the night before your procedure.                 No gum chewing or hard candies. You may drink clear liquids up to 2 hours                 before you are scheduled to arrive for your surgery- DO not drink clear                 liquids within 2 hours of the start of your surgery.                 Clear Liquids include:  water, apple juice without pulp, clear carbohydrate                 drink such as Clearfast of Gatorade, Black Coffee or Tea (Do not add                 anything to coffee or tea).  __X__2.  On the morning of surgery brush your teeth with toothpaste and water, you                may rinse your mouth with mouthwash if you wish.  Do not swallow any toothpaste of mouthwash.     _X__ 3.  No Alcohol for 24 hours before or after surgery.   _X__ 4.  Do Not Smoke or use e-cigarettes For 24 Hours Prior to Your Surgery.                 Do not use any chewable tobacco products for at least 6 hours prior to                 surgery.  ____  5.  Bring all medications with you on the day of surgery if instructed.   __x__  6.  Notify your doctor if there is any change in your medical condition      (cold, fever, infections).     Do not wear jewelry, make-up, hairpins, clips or nail polish. Do not wear lotions, powders, or perfumes. You may wear deodorant. Do not shave 48 hours prior to surgery. Men may shave face and neck. Do not bring valuables to the hospital.    [facility] is not responsible for any belongings or valuables.  Contacts, dentures or bridgework may not be worn into surgery. Leave your suitcase in the car. After  surgery it may be brought to your room. For patients admitted to the hospital, discharge time is determined by your treatment team.   Patients discharged the day of surgery will not be allowed to drive home.   Please read over the following fact sheets that you were given:    __x__ Take these medicines the morning of surgery with A SIP OF WATER:    1. Norgestimate-Ethinyl Estradiol Triphasic (TRINESSA, 28,) 0.18/0.215/0.25 MG-35 MCG tablet  2.   3.   4.  5.  6.  ____ Fleet Enema (as directed)   __x__ Use CHG Soap as directed  ____ Use inhalers on the day of surgery  ____ Stop metformin 2 days prior to  surgery    ____ Take 1/2 of usual insulin dose the night before surgery. No insulin the morning          of surgery.   ____ Stop Coumadin/Plavix/aspirin on   __x__ Stop Anti-inflammatories ibuprofen aspirin or aleve   ____ Stop supplements until after surgery.    ____ Bring C-Pap to the hospital.

## 2019-04-11 ENCOUNTER — Ambulatory Visit: Payer: Self-pay | Admitting: Anesthesiology

## 2019-04-11 ENCOUNTER — Encounter
Admission: RE | Disposition: A | Discharge status: Home / Self Care | Payer: Self-pay | Source: Home / Self Care | Attending: Otolaryngology

## 2019-04-11 ENCOUNTER — Encounter: Payer: Self-pay | Admitting: *Deleted

## 2019-04-11 ENCOUNTER — Ambulatory Visit
Admission: RE | Admit: 2019-04-11 | Discharge: 2019-04-11 | Disposition: A | Discharge status: Home / Self Care | Payer: Self-pay | Attending: Otolaryngology | Admitting: Otolaryngology

## 2019-04-11 DIAGNOSIS — Z793 Long term (current) use of hormonal contraceptives: Secondary | ICD-10-CM | POA: Insufficient documentation

## 2019-04-11 DIAGNOSIS — J3501 Chronic tonsillitis: Secondary | ICD-10-CM | POA: Insufficient documentation

## 2019-04-11 DIAGNOSIS — J039 Acute tonsillitis, unspecified: Secondary | ICD-10-CM | POA: Insufficient documentation

## 2019-04-11 DIAGNOSIS — D693 Immune thrombocytopenic purpura: Secondary | ICD-10-CM | POA: Insufficient documentation

## 2019-04-11 DIAGNOSIS — F17200 Nicotine dependence, unspecified, uncomplicated: Secondary | ICD-10-CM | POA: Insufficient documentation

## 2019-04-11 HISTORY — PX: TONSILLECTOMY: SHX5217

## 2019-04-11 LAB — URINE DRUG SCREEN, QUALITATIVE (ARMC ONLY)
Amphetamines, Ur Screen: NOT DETECTED
Barbiturates, Ur Screen: NOT DETECTED
Benzodiazepine, Ur Scrn: NOT DETECTED
Cannabinoid 50 Ng, Ur ~~LOC~~: POSITIVE — AB
Cocaine Metabolite,Ur ~~LOC~~: NOT DETECTED
MDMA (Ecstasy)Ur Screen: NOT DETECTED
Methadone Scn, Ur: NOT DETECTED
Opiate, Ur Screen: NOT DETECTED
Phencyclidine (PCP) Ur S: NOT DETECTED
Tricyclic, Ur Screen: NOT DETECTED

## 2019-04-11 LAB — POCT PREGNANCY, URINE: Preg Test, Ur: NEGATIVE

## 2019-04-11 SURGERY — TONSILLECTOMY
Anesthesia: General | Laterality: Bilateral

## 2019-04-11 MED ORDER — ACETAMINOPHEN 10 MG/ML IV SOLN
INTRAVENOUS | Status: AC
  Filled 2019-04-11: qty 100

## 2019-04-11 MED ORDER — FAMOTIDINE 20 MG PO TABS
ORAL_TABLET | ORAL | Status: AC
  Administered 2019-04-11: 20 mg via ORAL
  Filled 2019-04-11: qty 1

## 2019-04-11 MED ORDER — MIDAZOLAM HCL 2 MG/2ML IJ SOLN
INTRAMUSCULAR | Status: DC | PRN
  Administered 2019-04-11: 2 mg via INTRAVENOUS

## 2019-04-11 MED ORDER — FENTANYL CITRATE (PF) 100 MCG/2ML IJ SOLN
INTRAMUSCULAR | Status: AC
  Administered 2019-04-11: 25 ug via INTRAVENOUS
  Filled 2019-04-11: qty 2

## 2019-04-11 MED ORDER — HYDROCODONE-ACETAMINOPHEN 7.5-325 MG/15ML PO SOLN: 15.0000 mL | ORAL | 0 refills | Status: AC | PRN

## 2019-04-11 MED ORDER — FAMOTIDINE 20 MG PO TABS
20.0000 mg | ORAL_TABLET | Freq: Once | ORAL | Status: AC
  Administered 2019-04-11: 07:00:00 20 mg via ORAL

## 2019-04-11 MED ORDER — GLYCOPYRROLATE 0.2 MG/ML IJ SOLN
INTRAMUSCULAR | Status: DC | PRN
  Administered 2019-04-11: 0.2 mg via INTRAVENOUS

## 2019-04-11 MED ORDER — SILVER NITRATE-POT NITRATE 75-25 % EX MISC
CUTANEOUS | Status: AC
  Filled 2019-04-11: qty 1

## 2019-04-11 MED ORDER — FENTANYL CITRATE (PF) 100 MCG/2ML IJ SOLN
INTRAMUSCULAR | Status: DC | PRN
  Administered 2019-04-11 (×2): 50 ug via INTRAVENOUS

## 2019-04-11 MED ORDER — DEXMEDETOMIDINE HCL IN NACL 200 MCG/50ML IV SOLN
INTRAVENOUS | Status: DC | PRN
  Administered 2019-04-11 (×3): 8 ug via INTRAVENOUS
  Administered 2019-04-11: 4 ug via INTRAVENOUS

## 2019-04-11 MED ORDER — SUGAMMADEX SODIUM 200 MG/2ML IV SOLN
INTRAVENOUS | Status: DC | PRN
  Administered 2019-04-11: 300 mg via INTRAVENOUS

## 2019-04-11 MED ORDER — FENTANYL CITRATE (PF) 100 MCG/2ML IJ SOLN
INTRAMUSCULAR | Status: AC
  Filled 2019-04-11: qty 2

## 2019-04-11 MED ORDER — PROPOFOL 10 MG/ML IV BOLUS
INTRAVENOUS | Status: DC | PRN
  Administered 2019-04-11: 160 mg via INTRAVENOUS

## 2019-04-11 MED ORDER — MIDAZOLAM HCL 2 MG/2ML IJ SOLN
INTRAMUSCULAR | Status: AC
  Filled 2019-04-11: qty 2

## 2019-04-11 MED ORDER — HYDROCODONE-ACETAMINOPHEN 7.5-325 MG/15ML PO SOLN
ORAL | Status: AC
  Filled 2019-04-11: qty 15

## 2019-04-11 MED ORDER — PROPOFOL 10 MG/ML IV BOLUS
INTRAVENOUS | Status: AC
  Filled 2019-04-11: qty 40

## 2019-04-11 MED ORDER — SUCCINYLCHOLINE CHLORIDE 20 MG/ML IJ SOLN
INTRAMUSCULAR | Status: DC | PRN
  Administered 2019-04-11: 160 mg via INTRAVENOUS

## 2019-04-11 MED ORDER — FENTANYL CITRATE (PF) 100 MCG/2ML IJ SOLN
INTRAMUSCULAR | Status: AC
  Administered 2019-04-11: 09:00:00 25 ug via INTRAVENOUS
  Filled 2019-04-11: qty 2

## 2019-04-11 MED ORDER — ONDANSETRON HCL 4 MG/2ML IJ SOLN
INTRAMUSCULAR | Status: DC | PRN
  Administered 2019-04-11 (×2): 4 mg via INTRAVENOUS

## 2019-04-11 MED ORDER — ESMOLOL HCL 100 MG/10ML IV SOLN
INTRAVENOUS | Status: DC | PRN
  Administered 2019-04-11: 30 mg via INTRAVENOUS

## 2019-04-11 MED ORDER — LACTATED RINGERS IV SOLN
INTRAVENOUS | Status: DC
  Administered 2019-04-11 (×2): via INTRAVENOUS

## 2019-04-11 MED ORDER — HYDROCODONE-ACETAMINOPHEN 7.5-325 MG/15ML PO SOLN
15.0000 mL | ORAL | Status: DC | PRN
  Administered 2019-04-11: 15 mL via ORAL
  Filled 2019-04-11: qty 15

## 2019-04-11 MED ORDER — LIDOCAINE HCL (CARDIAC) PF 100 MG/5ML IV SOSY
PREFILLED_SYRINGE | INTRAVENOUS | Status: DC | PRN
  Administered 2019-04-11: 100 mg via INTRAVENOUS

## 2019-04-11 MED ORDER — DEXAMETHASONE SODIUM PHOSPHATE 10 MG/ML IJ SOLN
INTRAMUSCULAR | Status: DC | PRN
  Administered 2019-04-11: 10 mg via INTRAVENOUS

## 2019-04-11 MED ORDER — FENTANYL CITRATE (PF) 100 MCG/2ML IJ SOLN
25.0000 ug | INTRAMUSCULAR | Status: AC | PRN
  Administered 2019-04-11 (×6): 25 ug via INTRAVENOUS

## 2019-04-11 MED ORDER — ROCURONIUM BROMIDE 100 MG/10ML IV SOLN
INTRAVENOUS | Status: DC | PRN
  Administered 2019-04-11: 30 mg via INTRAVENOUS

## 2019-04-11 MED ORDER — ACETAMINOPHEN 10 MG/ML IV SOLN
INTRAVENOUS | Status: DC | PRN
  Administered 2019-04-11: 1000 mg via INTRAVENOUS

## 2019-04-11 SURGICAL SUPPLY — 22 items
"PENCIL ELECTRO HAND CTR " (MISCELLANEOUS) ×1 IMPLANT
BASIN GRAD PLASTIC 32OZ STRL (MISCELLANEOUS) ×3 IMPLANT
BLADE BOVIE TIP EXT 4 (BLADE) ×3 IMPLANT
CANISTER SUCT 1200ML W/VALVE (MISCELLANEOUS) ×3 IMPLANT
CATH ROBINSON RED A/P 10FR (CATHETERS) ×3 IMPLANT
COVER WAND RF STERILE (DRAPES) ×3 IMPLANT
ELECT CAUTERY BLADE TIP 2.5 (TIP) ×3
ELECT REM PT RETURN 9FT ADLT (ELECTROSURGICAL) ×3
ELECTRODE CAUTERY BLDE TIP 2.5 (TIP) ×1 IMPLANT
ELECTRODE REM PT RTRN 9FT ADLT (ELECTROSURGICAL) ×1 IMPLANT
GLOVE PROTEXIS LATEX SZ 7.5 (GLOVE) ×3 IMPLANT
GLOVE SURG LATEX 7.5 PF (GLOVE) ×1 IMPLANT
GOWN STRL REUS W/ TWL LRG LVL3 (GOWN DISPOSABLE) ×2 IMPLANT
GOWN STRL REUS W/TWL LRG LVL3 (GOWN DISPOSABLE) ×4
PACK BASIC III (MISCELLANEOUS) ×2
PACK SRG BSC III STRL LF (MISCELLANEOUS) ×1 IMPLANT
PENCIL ELECTRO HAND CTR (MISCELLANEOUS) ×3 IMPLANT
PENCIL SMOKE EVAC PLUME ELITE (ELECTROSURGICAL) ×3 IMPLANT
PENCIL SMOKE ULTRAEVAC 22 CON (MISCELLANEOUS) ×2 IMPLANT
SPONGE TONSIL TAPE 1 RFD (DISPOSABLE) ×3 IMPLANT
TUBING CONNECTING 10 (TUBING) ×2 IMPLANT
TUBING CONNECTING 10' (TUBING) ×1

## 2019-04-11 NOTE — Discharge Instructions (Addendum)
Tonsillectomy, Adult, Care After This sheet gives you information about how to care for yourself after your procedure. Your doctor may also give you more specific instructions. If you have problems or questions, contact your doctor. Follow these instructions at home: Eating and drinking   Follow instructions from your doctor about eating and drinking.  For many days after surgery, choose foods that are soft and cold. Examples are: ? Gelatin. ? Sherbet. ? Ice cream. ? Frozen ice pops.  If you have an upset stomach (are nauseous), choose liquids that are cold and that you can see through (clear liquids). Examples are water and apple juice without pulp. You can try thick liquids and soft foods when you can eat without throwing up (vomiting) and without too much pain. Examples are: ? Creamed soups. ? Soft, warm cereals, such as oatmeal or hot wheat cereal. ? Milk. ? Mashed potatoes. ? Applesauce.  Drink enough fluid to keep your pee (urine) clear or pale yellow. Driving  Do not drive for 24 hours if you were given a medicine to help you relax (sedative).  Do not drive or use heavy machinery while taking prescription pain medicine or until your health care provider approves. General instructions  Rest.  Keep your head raised (elevated) when lying down.  Take medicines only as told by your doctor. These include over-the-counter medicines and prescription medicines.  Do not use mouthwashes until your doctor says it is okay.  Gargle only as told by your doctor.  Stay away from people who are sick. Contact a doctor if:  Your pain gets worse or medicines do not help.  You have a fever.  You have a rash.  You feel light-headed or you pass out (faint).  You cannot swallow even a little liquid or spit (saliva).  Your pee is very dark. Get help right away if:  You have trouble breathing.  You bleed bright red blood from your throat.  You throw up bright red  blood. Summary  Follow instructions from your doctor about what you cannot eat or drink. For many days after surgery, choose foods that are soft and cold.  Talk with your doctor about how to keep your pain under control. This can help you rest and swallow better.  Get help right away if you bleed bright red blood from your throat or you throw up bright red blood. This information is not intended to replace advice given to you by your health care provider. Make sure you discuss any questions you have with your health care provider. Document Released: 09/04/2010 Document Revised: 07/15/2017 Document Reviewed: 06/25/2016 Elsevier Patient Education  2020 [place]   1) The drugs that you were given will stay in your system until tomorrow so for the next 24 hours you should not:  A) Drive an automobile B) Make any legal decisions C) Drink any alcoholic beverage   2) You may resume regular meals tomorrow.  Today it is better to start with liquids and gradually work up to solid foods.  You may eat anything you prefer, but it is better to start with liquids, then soup and crackers, and gradually work up to solid foods.   3) Please notify your doctor immediately if you have any unusual bleeding, trouble breathing, redness and pain at the surgery site, drainage, fever, or pain not relieved by medication.    4) Additional Instructions:        Please contact your physician  with any problems or Same Day Surgery at [phone], Monday through Friday 6 am to 4 pm, or [place] at [facility] number at [phone].

## 2019-04-11 NOTE — Transfer of Care (Signed)
Immediate Anesthesia Transfer of Care Note  Patient: Cheyenne Barnes  Procedure(s) Performed: TONSILLECTOMY (Bilateral )  Patient Location: PACU  Anesthesia Type:General  Level of Consciousness: awake, alert , oriented and patient cooperative  Airway & Oxygen Therapy: Patient Spontanous Breathing and Patient connected to face mask oxygen  Post-op Assessment: Report given to RN and Post -op Vital signs reviewed and stable  Post vital signs: Reviewed and stable  Last Vitals:  Vitals Value Taken Time  BP 137/84 04/11/19 0825  Temp 36.9 C 04/11/19 0825  Pulse 83 04/11/19 0829  Resp 14 04/11/19 0829  SpO2 100 % 04/11/19 0829  Vitals shown include unvalidated device data.  Last Pain:  Vitals:   04/11/19 0624  TempSrc: Temporal  PainSc: 0-No pain         Complications: No apparent anesthesia complications

## 2019-04-11 NOTE — Anesthesia Post-op Follow-up Note (Signed)
Anesthesia QCDR form completed.        

## 2019-04-11 NOTE — Anesthesia Postprocedure Evaluation (Signed)
Anesthesia Post Note  Patient: Cheyenne Barnes  Procedure(s) Performed: TONSILLECTOMY (Bilateral )  Patient location during evaluation: PACU Anesthesia Type: General Level of consciousness: awake and alert Pain management: pain level controlled Vital Signs Assessment: post-procedure vital signs reviewed and stable Respiratory status: spontaneous breathing, nonlabored ventilation, respiratory function stable and patient connected to nasal cannula oxygen Cardiovascular status: blood pressure returned to baseline and stable Postop Assessment: no apparent nausea or vomiting Anesthetic complications: no     Last Vitals:  Vitals:   04/11/19 0928 04/11/19 1034  BP: 114/72 114/79  Pulse: (!) 59 83  Resp: 16 16  Temp: (!) 36.4 C   SpO2: 99% 99%    Last Pain:  Vitals:   04/11/19 1034  TempSrc:   PainSc: 7                  Precious Haws Piscitello

## 2019-04-11 NOTE — Anesthesia Procedure Notes (Signed)
Procedure Name: Intubation Performed by: Kelton Pillar, CRNA Pre-anesthesia Checklist: Emergency Drugs available, Patient identified, Suction available and Patient being monitored Patient Re-evaluated:Patient Re-evaluated prior to induction Oxygen Delivery Method: Circle system utilized Preoxygenation: Pre-oxygenation with 100% oxygen Induction Type: IV induction Laryngoscope Size: McGraph and 3 Grade View: Grade I Tube type: Oral Tube size: 7.0 mm Number of attempts: 1 Airway Equipment and Method: Stylet Placement Confirmation: ETT inserted through vocal cords under direct vision,  positive ETCO2,  CO2 detector and breath sounds checked- equal and bilateral Secured at: 22 cm Tube secured with: Tape Dental Injury: Teeth and Oropharynx as per pre-operative assessment

## 2019-04-11 NOTE — Anesthesia Preprocedure Evaluation (Signed)
Anesthesia Evaluation  Patient identified by MRN, date of birth, ID band Patient awake    Reviewed: Allergy & Precautions, H&P , NPO status , Patient's Chart, lab work & pertinent test results  History of Anesthesia Complications (+) Family history of anesthesia reaction and history of anesthetic complications  Airway Mallampati: III  TM Distance: >3 FB Neck ROM: full    Dental  (+) Chipped, Poor Dentition   Pulmonary neg shortness of breath, Current Smoker and Patient abstained from smoking.,           Cardiovascular Exercise Tolerance: Good (-) angina(-) Past MI and (-) DOE negative cardio ROS       Neuro/Psych negative neurological ROS  negative psych ROS   GI/Hepatic negative GI ROS, Neg liver ROS, neg GERD  ,  Endo/Other  negative endocrine ROS  Renal/GU      Musculoskeletal   Abdominal   Peds  Hematology negative hematology ROS (+)   Anesthesia Other Findings Past Medical History: No date: Family history of adverse reaction to anesthesia     Comment:  unsure of reaction No date: 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) 02/19/2013: Thrombocytopenia ([place])  Past Surgical History: 01/09/2018: INCISION AND DRAINAGE OF PERITONSILLAR ABCESS; N/A     Comment:  Procedure: INCISION AND DRAINAGE OF PERITONSILLAR               ABCESS;  Surgeon: Sherri Sear, MD;  Location:               [facility] ORS;  Service: ENT;  Laterality: N/A;  BMI    Body Mass Index: 32.80 kg/m      Reproductive/Obstetrics negative OB ROS                             Anesthesia Physical Anesthesia Plan  ASA: II  Anesthesia Plan: General ETT   Post-op Pain Management:    Induction: Intravenous  PONV Risk Score and Plan: Ondansetron, Dexamethasone, Midazolam and Treatment may vary due to age or medical condition  Airway Management Planned: Oral ETT  Additional Equipment:    Intra-op Plan:   Post-operative Plan: Extubation in OR  Informed Consent: I have reviewed the patients History and Physical, chart, labs and discussed the procedure including the risks, benefits and alternatives for the proposed anesthesia with the patient or authorized representative who has indicated his/her understanding and acceptance.     Dental Advisory Given  Plan Discussed with: Anesthesiologist, CRNA and Surgeon  Anesthesia Plan Comments: (Patient consented for risks of anesthesia including but not limited to:  - adverse reactions to medications - damage to teeth, lips or other oral mucosa - sore throat or hoarseness - Damage to heart, brain, lungs or loss of life  Patient voiced understanding.)        Anesthesia Quick Evaluation

## 2019-04-11 NOTE — Op Note (Signed)
04/11/2019  8:20 AM    Cheyenne Barnes  [id number]   Pre-Op Dx: Chronic tonsillitis  Post-op Dx: Chronic tonsillitis  Proc: Tonsillectomy  Surg:  [name]  Anes:  GOT  EBL: 30 mL  Comp: None  Findings: Very large and scarred down tonsils.  The right side was specially scarred down and had lots of tonsil stones inside of it.   Procedure: Patient was brought to the operating room and given general anesthesia by oral endotracheal intubation.  Once the patient was asleep a [name] was used to visualize the oropharynx.  Her tonsils were very large.  The soft palate was retracted to visualize the adenoid area and this was completely shrunk down with no significant adenoid tissue here at all.  The nasopharynx was clear.  The left tonsil was grasped and pulled medially.  The anterior pillar was incised with electrocautery.  The tonsil was dissected from its fossa using blunt dissection and electrocautery to control bleeding as the tonsil was dissected out.  Once the tonsil was removed bleeding was controlled with direct pressure and electrocautery.  This procedure was repeated on the right side.  The anterior pillar was incised with electrocautery.  The tonsil was dissected from its fossa using blunt dissection and electrocautery.  It was very scarred down to the muscle here with signs of previous abscess.  There were lots of tonsil stones inside the tonsil that were coming out as the tonsils were removed.  Once the tonsil was completely removed from the fossa bleeding was controlled with direct pressure and electrocautery.  Tonsil sponges were used to wipe the area several times to make sure everything was nice and clear and that there was no further bleeding.  Dispo:   To PACU to be discharged home  Plan: To follow-up in the office in about 2 weeks to make sure she is doing well.  She knows that she will have significant discomfort postop and will need to remain hydrated.   She will push liquids and soft foods as tolerated.  I have given her some hydrocodone with Tylenol for severe pain if she wants to try it.  She had nausea with codeine previously but never had a rash or any signs of acute allergic reaction but more of a side effect from the codeine.  She knows that she can use Tylenol and ibuprofen as needed for the pain, but can try the hydrocodone if she wants to and see if it does not make her nauseated.  She will call the office if she has any problems.  Elon Alas [name]  04/11/2019 8:20 AM

## 2019-04-11 NOTE — H&P (Signed)
H&P has been reviewed and patient reevaluated, no changes necessary. To be downloaded later.  

## 2019-04-12 ENCOUNTER — Encounter: Payer: Self-pay | Admitting: Otolaryngology

## 2019-04-12 ENCOUNTER — Ambulatory Visit: Admit: 2019-04-12 | Payer: Medicaid Other | Admitting: Otolaryngology

## 2019-04-12 LAB — SURGICAL PATHOLOGY

## 2019-04-12 SURGERY — TONSILLECTOMY
Anesthesia: General | Laterality: Bilateral

## 2019-05-26 ENCOUNTER — Other Ambulatory Visit: Payer: Self-pay

## 2019-05-26 ENCOUNTER — Emergency Department
Admission: EM | Admit: 2019-05-26 | Discharge: 2019-05-26 | Disposition: A | Discharge status: Home / Self Care | Payer: Medicaid Other | Attending: Emergency Medicine | Admitting: Emergency Medicine

## 2019-05-26 ENCOUNTER — Emergency Department: Payer: Medicaid Other

## 2019-05-26 ENCOUNTER — Encounter: Payer: Self-pay | Admitting: Emergency Medicine

## 2019-05-26 DIAGNOSIS — S6701XA Crushing injury of right thumb, initial encounter: Secondary | ICD-10-CM | POA: Insufficient documentation

## 2019-05-26 DIAGNOSIS — Y999 Unspecified external cause status: Secondary | ICD-10-CM | POA: Insufficient documentation

## 2019-05-26 DIAGNOSIS — W230XXA Caught, crushed, jammed, or pinched between moving objects, initial encounter: Secondary | ICD-10-CM | POA: Insufficient documentation

## 2019-05-26 DIAGNOSIS — Z79899 Other long term (current) drug therapy: Secondary | ICD-10-CM | POA: Insufficient documentation

## 2019-05-26 DIAGNOSIS — Y929 Unspecified place or not applicable: Secondary | ICD-10-CM | POA: Insufficient documentation

## 2019-05-26 DIAGNOSIS — S6710XA Crushing injury of unspecified finger(s), initial encounter: Secondary | ICD-10-CM

## 2019-05-26 DIAGNOSIS — Y939 Activity, unspecified: Secondary | ICD-10-CM | POA: Insufficient documentation

## 2019-05-26 MED ORDER — TRAMADOL HCL 50 MG PO TABS: 50.0000 mg | ORAL_TABLET | Freq: Four times a day (QID) | ORAL | 0 refills | Status: DC | PRN

## 2019-05-26 NOTE — ED Triage Notes (Signed)
Pt to ED via POV c/o right thumb pain. Pt states that she slammed her thumb in the door last night and is now having pain and is unable to bend it. Pt is in NAD at this time.
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
## 2019-05-26 NOTE — Discharge Instructions (Signed)
Follow-up with your primary care provider or [facility] acute care if any continued problems.  Ice and elevation today to reduce swelling and help with pain.  Also a finger splint was applied to your finger for protection.  Pain medication was sent to your pharmacy.  After this you may take Tylenol or ibuprofen.

## 2019-05-26 NOTE — ED Notes (Signed)
Pt c/o right thumb injury after closing it in the trunk of her car last night. Pt states she is unable to bend her right thumb and the bed of the nail is turning blue.

## 2019-05-29 ENCOUNTER — Ambulatory Visit: Payer: Self-pay

## 2019-06-04 ENCOUNTER — Other Ambulatory Visit: Payer: Self-pay

## 2019-06-04 DIAGNOSIS — Z20822 Contact with and (suspected) exposure to covid-19: Secondary | ICD-10-CM

## 2019-06-06 LAB — NOVEL CORONAVIRUS, NAA: SARS-CoV-2, NAA: DETECTED — AB

## 2019-06-13 ENCOUNTER — Emergency Department: Payer: Medicaid Other

## 2019-06-13 ENCOUNTER — Encounter: Payer: Self-pay | Admitting: Emergency Medicine

## 2019-06-13 ENCOUNTER — Other Ambulatory Visit: Payer: Self-pay

## 2019-06-13 ENCOUNTER — Emergency Department
Admission: EM | Admit: 2019-06-13 | Discharge: 2019-06-13 | Disposition: A | Discharge status: Home / Self Care | Payer: Medicaid Other | Attending: Emergency Medicine | Admitting: Emergency Medicine

## 2019-06-13 DIAGNOSIS — U071 COVID-19: Secondary | ICD-10-CM | POA: Insufficient documentation

## 2019-06-13 DIAGNOSIS — Z79899 Other long term (current) drug therapy: Secondary | ICD-10-CM | POA: Insufficient documentation

## 2019-06-13 MED ORDER — BENZONATATE 100 MG PO CAPS: ORAL_CAPSULE | ORAL | 0 refills | Status: DC

## 2019-06-13 MED ORDER — PREDNISONE 10 MG (21) PO TBPK: ORAL_TABLET | ORAL | 0 refills | Status: DC

## 2019-06-13 MED ORDER — ALBUTEROL SULFATE HFA 108 (90 BASE) MCG/ACT IN AERS: 2.0000 | INHALATION_SPRAY | Freq: Four times a day (QID) | RESPIRATORY_TRACT | 0 refills | Status: DC | PRN

## 2019-06-13 NOTE — Discharge Instructions (Signed)
You have a normal exam and CXR following your recent COVID test. Take the prescription meds as directed. Follow-u with your provider or return as needed.

## 2019-06-13 NOTE — ED Triage Notes (Signed)
Pt in via POV, reports testing positive for Covid last week, continues with intermittent shortness of breath.  Ambulatory to triage, vitals WDL, NAD noted at this time.

## 2019-06-13 NOTE — ED Notes (Signed)
Pt presentation discussed with EDP, Paduchowski; see new orders. 
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
## 2019-06-13 NOTE — ED Notes (Signed)
Signature pad not working. Pt verbalized understanding of d/c instructions.  

## 2019-06-29 ENCOUNTER — Other Ambulatory Visit: Payer: Self-pay | Admitting: Advanced Practice Midwife

## 2019-07-02 ENCOUNTER — Ambulatory Visit: Payer: Medicaid Other

## 2019-07-03 ENCOUNTER — Encounter: Payer: Self-pay | Admitting: Family Medicine

## 2019-07-03 ENCOUNTER — Telehealth: Payer: Self-pay

## 2019-07-03 ENCOUNTER — Ambulatory Visit (LOCAL_COMMUNITY_HEALTH_CENTER): Payer: Medicaid Other | Admitting: Family Medicine

## 2019-07-03 ENCOUNTER — Other Ambulatory Visit: Payer: Self-pay

## 2019-07-03 VITALS — BP 122/85 | Ht 63.0 in | Wt 192.0 lb

## 2019-07-03 DIAGNOSIS — Z3009 Encounter for other general counseling and advice on contraception: Secondary | ICD-10-CM | POA: Diagnosis not present

## 2019-07-03 DIAGNOSIS — Z30011 Encounter for initial prescription of contraceptive pills: Secondary | ICD-10-CM

## 2019-07-03 DIAGNOSIS — Z113 Encounter for screening for infections with a predominantly sexual mode of transmission: Secondary | ICD-10-CM

## 2019-07-03 MED ORDER — NORGESTIM-ETH ESTRAD TRIPHASIC 0.18/0.215/0.25 MG-25 MCG PO TABS: 1.0000 | ORAL_TABLET | Freq: Every day | ORAL | 5 refills | Status: DC

## 2019-07-03 NOTE — Telephone Encounter (Signed)
Phone call to patient per request of provider Ola Spurr, CNM regarding OCP refill authorization from pharmacy. Patient states she is currently not using any birth control at all. Patient agreeable to OV to discuss birth control options. FP OV scheduled for today at 3:00. Instructed patient to arrive at 2:45 for check in. Hal Morales, RN

## 2019-07-03 NOTE — Progress Notes (Signed)
Pt here for more birth control pills and STD screening. Pt also desires blood work for HIV and syphillis. Pt reports the last time she took OCP's was the end of 03/2019. Last sex was 07/01/2019 without condom.Ronny Bacon, RN

## 2019-07-03 NOTE — Progress Notes (Signed)
   [facility] problem visit  [place] Department  Subjective:  JODEAN VALADE is a 23 y.o. being seen today for   Chief Complaint  Patient presents with  . Contraception    birth control pills  . SEXUALLY TRANSMITTED DISEASE    STD screening    HPI   Client is here to restart OCPs- Ortho Tri Cyclen.  Client states that she ran out of pills in 03/2019.  States that she didn't come in sooner because she had her tonsil removed and couldn't swallow.  She has resumed sexual activity and wants to restart. She and partner aren't using condoms.  She had a period 10/mid/2020 and started her period 07/01/2019.  Client has had a negative PR 06/26/2019.  Does the patient have a current or past history of drug use? No   No components found for: HCV]   Health Maintenance Due  Topic Date Due  . INFLUENZA VACCINE  03/17/2019    ROS  The following portions of the patient's history were reviewed and updated as appropriate: allergies, current medications, past family history, past medical history, past social history, past surgical history and problem list. Problem list updated.   See flowsheet for other program required questions.  Objective:   Vitals:   07/03/19 1506  BP: 122/85  Weight: 192 lb (87.1 kg)  Height: 5\' 3"  (1.6 m)    Physical Exam Constitutional:      Appearance: She is obese.  HENT:     Mouth/Throat:     Mouth: Mucous membranes are moist.     Pharynx: No oropharyngeal exudate or posterior oropharyngeal erythema.  Neck:     Musculoskeletal: Neck supple. No muscular tenderness.  Abdominal:     Tenderness: There is no abdominal tenderness.  Genitourinary:    Comments: Client declined pelvic exam.  Self-collect gc/chlamyudia Lymphadenopathy:     Cervical: No cervical adenopathy.  Skin:    General: Skin is warm and dry.     Findings: No lesion or rash.  Neurological:     Mental Status: She is alert.    Assessment and Plan:   FEY COGHILL is a 23 y.o. female presenting to the [facility] Department for a Women's Health problem visit  1. General counseling and advice on contraceptive management   2. OCP (oral contraceptive pills) initiation  - Norgestimate-Ethinyl Estradiol Triphasic 0.18/0.215/0.25 MG-25 MCG tab; Take 1 tablet by mouth daily.  Dispense: 1 Package; Refill: 5 Co to use condoms always. Co. Client that she could perform an OTC PT in 2 weeks .  3. Screening examination for venereal disease - Chlamydia/Gonorrhea [place] [facility] - HIV [place] [facility] - Syphilis Serology, [facility]     No follow-ups on file.  No future appointments.  Hassell Done, FNP

## 2019-07-04 NOTE — Progress Notes (Signed)
Counseled pt per provider orders and pt states understanding. Pt aware that rx for OCP's were sent to pt's pharmacy on file by Hassell Done, FNP. Awaiting test results. Provider orders completed.Ronny Bacon, RN

## 2019-07-19 ENCOUNTER — Encounter: Payer: Self-pay | Admitting: Emergency Medicine

## 2019-07-19 ENCOUNTER — Other Ambulatory Visit: Payer: Self-pay

## 2019-07-19 ENCOUNTER — Emergency Department
Admission: EM | Admit: 2019-07-19 | Discharge: 2019-07-19 | Disposition: A | Discharge status: Home / Self Care | Payer: Medicaid Other | Attending: Emergency Medicine | Admitting: Emergency Medicine

## 2019-07-19 DIAGNOSIS — Z793 Long term (current) use of hormonal contraceptives: Secondary | ICD-10-CM | POA: Insufficient documentation

## 2019-07-19 DIAGNOSIS — N760 Acute vaginitis: Secondary | ICD-10-CM

## 2019-07-19 LAB — CBC
HCT: 40.4 % (ref 36.0–46.0)
Hemoglobin: 13.5 g/dL (ref 12.0–15.0)
MCH: 34.5 pg — ABNORMAL HIGH (ref 26.0–34.0)
MCHC: 33.4 g/dL (ref 30.0–36.0)
MCV: 103.3 fL — ABNORMAL HIGH (ref 80.0–100.0)
Platelets: 284 10*3/uL (ref 150–400)
RBC: 3.91 MIL/uL (ref 3.87–5.11)
RDW: 12.5 % (ref 11.5–15.5)
WBC: 6.7 10*3/uL (ref 4.0–10.5)
nRBC: 0 % (ref 0.0–0.2)

## 2019-07-19 LAB — URINALYSIS, COMPLETE (UACMP) WITH MICROSCOPIC
Bacteria, UA: NONE SEEN
Bilirubin Urine: NEGATIVE
Glucose, UA: NEGATIVE mg/dL
Ketones, ur: NEGATIVE mg/dL
Leukocytes,Ua: NEGATIVE
Nitrite: NEGATIVE
Protein, ur: NEGATIVE mg/dL
Specific Gravity, Urine: 1.019 (ref 1.005–1.030)
pH: 5 (ref 5.0–8.0)

## 2019-07-19 LAB — COMPREHENSIVE METABOLIC PANEL
ALT: 21 U/L (ref 0–44)
AST: 20 U/L (ref 15–41)
Albumin: 4.6 g/dL (ref 3.5–5.0)
Alkaline Phosphatase: 78 U/L (ref 38–126)
Anion gap: 8 (ref 5–15)
BUN: 13 mg/dL (ref 6–20)
CO2: 23 mmol/L (ref 22–32)
Calcium: 9.8 mg/dL (ref 8.9–10.3)
Chloride: 106 mmol/L (ref 98–111)
Creatinine, Ser: 0.83 mg/dL (ref 0.44–1.00)
GFR calc Af Amer: >60 mL/min (ref >60)
GFR calc non Af Amer: >60 mL/min (ref >60)
Glucose, Bld: 95 mg/dL (ref 70–99)
Potassium: 4.9 mmol/L (ref 3.5–5.1)
Sodium: 137 mmol/L (ref 135–145)
Total Bilirubin: 0.6 mg/dL (ref 0.3–1.2)
Total Protein: 7.8 g/dL (ref 6.5–8.1)

## 2019-07-19 LAB — LIPASE, BLOOD: Lipase: 29 U/L (ref 11–51)

## 2019-07-19 LAB — WET PREP, GENITAL
Sperm: NONE SEEN
Trich, Wet Prep: NONE SEEN
Yeast Wet Prep HPF POC: NONE SEEN

## 2019-07-19 LAB — POCT PREGNANCY, URINE: Preg Test, Ur: NEGATIVE

## 2019-07-19 MED ORDER — SODIUM CHLORIDE 0.9% FLUSH: 3.0000 mL | Freq: Once | INTRAVENOUS | Status: DC

## 2019-07-19 MED ORDER — METRONIDAZOLE 500 MG PO TABS: 500.0000 mg | ORAL_TABLET | Freq: Two times a day (BID) | ORAL | 0 refills | Status: DC

## 2019-07-19 NOTE — ED Notes (Signed)
See triage note  Presents with pain to RLQ  States she developed pain after working out yesterday  Pain is non radiating  Pain increases with movement and touch  Denies any fever or n/v   Also denies any urinary sx's or vaginal discharge
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
## 2019-07-19 NOTE — Discharge Instructions (Signed)
Follow-up with your primary care provider if any continued problems or the health department.  Begin taking antibiotics until they are completely gone.

## 2019-07-19 NOTE — ED Triage Notes (Signed)
Right lower abd pain x24 hours, pain worsens on palpation. No fever , nausea , denies urinary or vaginal symptoms.

## 2019-08-20 ENCOUNTER — Emergency Department
Admission: EM | Admit: 2019-08-20 | Discharge: 2019-08-20 | Disposition: A | Discharge status: Home / Self Care | Payer: Self-pay | Attending: Emergency Medicine | Admitting: Emergency Medicine

## 2019-08-20 ENCOUNTER — Other Ambulatory Visit: Payer: Self-pay

## 2019-08-20 ENCOUNTER — Emergency Department: Payer: Self-pay

## 2019-08-20 ENCOUNTER — Encounter: Payer: Self-pay | Admitting: Medical Oncology

## 2019-08-20 DIAGNOSIS — Z79899 Other long term (current) drug therapy: Secondary | ICD-10-CM | POA: Insufficient documentation

## 2019-08-20 DIAGNOSIS — M436 Torticollis: Secondary | ICD-10-CM | POA: Insufficient documentation

## 2019-08-20 MED ORDER — NAPROXEN 500 MG PO TABS: 500.0000 mg | ORAL_TABLET | Freq: Two times a day (BID) | ORAL | 0 refills | Status: DC

## 2019-08-20 MED ORDER — METHOCARBAMOL 500 MG PO TABS: ORAL_TABLET | ORAL | 0 refills | Status: DC

## 2019-08-20 MED ORDER — HYDROCODONE-ACETAMINOPHEN 5-325 MG PO TABS: 1.0000 | ORAL_TABLET | Freq: Four times a day (QID) | ORAL | 0 refills | Status: DC | PRN

## 2019-08-20 MED ORDER — KETOROLAC TROMETHAMINE 30 MG/ML IJ SOLN
30.0000 mg | Freq: Once | INTRAMUSCULAR | Status: AC
  Administered 2019-08-20: 12:00:00 30 mg via INTRAMUSCULAR
  Filled 2019-08-20: qty 1

## 2019-08-20 NOTE — ED Triage Notes (Signed)
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 Pt reports pain with movement.

## 2019-08-20 NOTE — ED Notes (Signed)
See triage note  Presents with pain to left side of neck  States she developed pain after lifting a heavy tray at work

## 2019-08-20 NOTE — Discharge Instructions (Signed)
Follow-up with your primary care provider or [facility] acute care if any continued problems.  Return to the emergency department if any severe worsening of your symptoms.  Begin taking medication as directed.  The hydrocodone is for moderate to severe pain every 6 hours if needed.  Do not drive or operate machinery while taking this medication and also the methocarbamol which is a muscle relaxant could cause drowsiness.  Naproxen 500 mg twice daily with food.  This medication is safe to be take while driving or working as it does not cause drowsiness.  Ice or heat to your neck as needed for discomfort.  Begin taking medication a day.
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
## 2019-10-17 ENCOUNTER — Emergency Department
Admission: EM | Admit: 2019-10-17 | Discharge: 2019-10-17 | Disposition: A | Discharge status: Home / Self Care | Payer: Medicaid Other | Attending: Emergency Medicine | Admitting: Emergency Medicine

## 2019-10-17 ENCOUNTER — Encounter: Payer: Self-pay | Admitting: Emergency Medicine

## 2019-10-17 DIAGNOSIS — K0889 Other specified disorders of teeth and supporting structures: Secondary | ICD-10-CM

## 2019-10-17 DIAGNOSIS — F1210 Cannabis abuse, uncomplicated: Secondary | ICD-10-CM | POA: Insufficient documentation

## 2019-10-17 DIAGNOSIS — K029 Dental caries, unspecified: Secondary | ICD-10-CM

## 2019-10-17 MED ORDER — AMOXICILLIN 500 MG PO CAPS: 500.0000 mg | ORAL_CAPSULE | Freq: Three times a day (TID) | ORAL | 0 refills | Status: AC

## 2019-10-17 MED ORDER — METRONIDAZOLE 500 MG PO TABS: 500.0000 mg | ORAL_TABLET | Freq: Three times a day (TID) | ORAL | 0 refills | Status: AC

## 2019-10-17 MED ORDER — METRONIDAZOLE 500 MG PO TABS
500.0000 mg | ORAL_TABLET | Freq: Once | ORAL | Status: AC
  Administered 2019-10-17: 500 mg via ORAL
  Filled 2019-10-17: qty 1

## 2019-10-17 MED ORDER — OXYCODONE-ACETAMINOPHEN 5-325 MG PO TABS: 2.0000 | ORAL_TABLET | Freq: Four times a day (QID) | ORAL | 0 refills | Status: DC | PRN

## 2019-10-17 MED ORDER — MAGIC MOUTHWASH W/LIDOCAINE: 5.0000 mL | Freq: Four times a day (QID) | ORAL | 0 refills | Status: DC | PRN

## 2019-10-17 MED ORDER — KETOROLAC TROMETHAMINE 30 MG/ML IJ SOLN
30.0000 mg | Freq: Once | INTRAMUSCULAR | Status: AC
  Administered 2019-10-17: 30 mg via INTRAMUSCULAR
  Filled 2019-10-17: qty 1

## 2019-10-17 MED ORDER — LIDOCAINE VISCOUS HCL 2 % MT SOLN
15.0000 mL | Freq: Once | OROMUCOSAL | Status: AC
  Administered 2019-10-17: 15 mL via OROMUCOSAL
  Filled 2019-10-17: qty 15

## 2019-10-17 MED ORDER — AMOXICILLIN 500 MG PO CAPS
500.0000 mg | ORAL_CAPSULE | Freq: Once | ORAL | Status: AC
  Administered 2019-10-17: 500 mg via ORAL
  Filled 2019-10-17: qty 1

## 2019-10-17 NOTE — ED Triage Notes (Signed)
Pt c/o upper right dental pain x3 days. Pt last took ibuprofen x4 hours ago without relief. Pt denies fever and drainage.

## 2019-10-17 NOTE — Discharge Instructions (Addendum)
You have been seen in the Emergency Department (ED) today for dental pain.  Please take your prescribed antibiotic.  You may take pain medication as needed but ONLY as prescribed.  You should also take over-the-counter pain medication such as ibuprofen according to the label instructions unless a doctor has previously told you to avoid this type of medication (due to stomach ulcers, for example).  Alternatively you can take ibuprofen 600 mg by mouth three times daily with meals for no more than 5 days. ° °Take Percocet as prescribed for severe pain. Do not drink alcohol, drive or participate in any other potentially dangerous activities while taking this medication as it may make you sleepy. Do not take this medication with any other sedating medications, either prescription or over-the-counter. If you were prescribed Percocet or Vicodin, do not take these with acetaminophen (Tylenol) as it is already contained within these medications. °  °This medication is an opiate (or narcotic) pain medication and can be habit forming.  Use it as little as possible to achieve adequate pain control.  Do not use or use it with extreme caution if you have a history of opiate abuse or dependence.  If you are on a pain contract with your primary care doctor or a pain specialist, be sure to let them know you were prescribed this medication today from the [facility].  This medication is intended for your use only - do not give any to anyone else and keep it in a secure place where nobody else, especially children, have access to it.  It will also cause or worsen constipation, so you may want to consider taking an over-the-counter stool softener while you are taking this medication. ° °Please see you dentist as soon as possible; only a dentist will be able to fix your problem(s).  Please see below for dental follow up options. ° °Return to the ED if you develop worsening pain, fever, pus/drainage, difficulty  breathing, or other symptoms that concern you. °
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
## 2019-10-17 NOTE — ED Notes (Signed)
Pt has upper right tooth pain x3days. No broken teeth, no drainage

## 2019-11-13 ENCOUNTER — Emergency Department
Admission: EM | Admit: 2019-11-13 | Discharge: 2019-11-13 | Disposition: A | Discharge status: Home / Self Care | Payer: No Typology Code available for payment source | Attending: Emergency Medicine | Admitting: Emergency Medicine

## 2019-11-13 ENCOUNTER — Other Ambulatory Visit: Payer: Self-pay

## 2019-11-13 ENCOUNTER — Emergency Department: Payer: No Typology Code available for payment source

## 2019-11-13 ENCOUNTER — Encounter: Payer: Self-pay | Admitting: Emergency Medicine

## 2019-11-13 DIAGNOSIS — Z79899 Other long term (current) drug therapy: Secondary | ICD-10-CM | POA: Diagnosis not present

## 2019-11-13 DIAGNOSIS — Z046 Encounter for general psychiatric examination, requested by authority: Secondary | ICD-10-CM | POA: Diagnosis not present

## 2019-11-13 DIAGNOSIS — Y92410 Unspecified street and highway as the place of occurrence of the external cause: Secondary | ICD-10-CM | POA: Diagnosis not present

## 2019-11-13 DIAGNOSIS — Y9389 Activity, other specified: Secondary | ICD-10-CM | POA: Diagnosis not present

## 2019-11-13 DIAGNOSIS — M542 Cervicalgia: Secondary | ICD-10-CM | POA: Insufficient documentation

## 2019-11-13 DIAGNOSIS — Y999 Unspecified external cause status: Secondary | ICD-10-CM | POA: Diagnosis not present

## 2019-11-13 DIAGNOSIS — S0990XA Unspecified injury of head, initial encounter: Secondary | ICD-10-CM | POA: Diagnosis present

## 2019-11-13 DIAGNOSIS — S060X0A Concussion without loss of consciousness, initial encounter: Secondary | ICD-10-CM

## 2019-11-13 DIAGNOSIS — S8012XA Contusion of left lower leg, initial encounter: Secondary | ICD-10-CM | POA: Insufficient documentation

## 2019-11-13 DIAGNOSIS — S8011XA Contusion of right lower leg, initial encounter: Secondary | ICD-10-CM | POA: Insufficient documentation

## 2019-11-13 DIAGNOSIS — Z634 Disappearance and death of family member: Secondary | ICD-10-CM | POA: Insufficient documentation

## 2019-11-13 LAB — CBC WITH DIFFERENTIAL/PLATELET
Abs Immature Granulocytes: 0.04 10*3/uL (ref 0.00–0.07)
Basophils Absolute: 0 10*3/uL (ref 0.0–0.1)
Basophils Relative: 0 %
Eosinophils Absolute: 0 10*3/uL (ref 0.0–0.5)
Eosinophils Relative: 0 %
HCT: 40.6 % (ref 36.0–46.0)
Hemoglobin: 14.3 g/dL (ref 12.0–15.0)
Immature Granulocytes: 0 %
Lymphocytes Relative: 20 %
Lymphs Abs: 2 10*3/uL (ref 0.7–4.0)
MCH: 35.4 pg — ABNORMAL HIGH (ref 26.0–34.0)
MCHC: 35.2 g/dL (ref 30.0–36.0)
MCV: 100.5 fL — ABNORMAL HIGH (ref 80.0–100.0)
Monocytes Absolute: 0.7 10*3/uL (ref 0.1–1.0)
Monocytes Relative: 7 %
Neutro Abs: 7 10*3/uL (ref 1.7–7.7)
Neutrophils Relative %: 73 %
Platelets: 266 10*3/uL (ref 150–400)
RBC: 4.04 MIL/uL (ref 3.87–5.11)
RDW: 13.4 % (ref 11.5–15.5)
WBC: 9.8 10*3/uL (ref 4.0–10.5)
nRBC: 0 % (ref 0.0–0.2)

## 2019-11-13 LAB — ETHANOL: Alcohol, Ethyl (B): <10 mg/dL (ref <10)

## 2019-11-13 LAB — COMPREHENSIVE METABOLIC PANEL
ALT: 14 U/L (ref 0–44)
AST: 19 U/L (ref 15–41)
Albumin: 4.3 g/dL (ref 3.5–5.0)
Alkaline Phosphatase: 79 U/L (ref 38–126)
Anion gap: 12 (ref 5–15)
BUN: 12 mg/dL (ref 6–20)
CO2: 21 mmol/L — ABNORMAL LOW (ref 22–32)
Calcium: 9.4 mg/dL (ref 8.9–10.3)
Chloride: 106 mmol/L (ref 98–111)
Creatinine, Ser: 0.79 mg/dL (ref 0.44–1.00)
GFR calc Af Amer: >60 mL/min (ref >60)
GFR calc non Af Amer: >60 mL/min (ref >60)
Glucose, Bld: 94 mg/dL (ref 70–99)
Potassium: 3.7 mmol/L (ref 3.5–5.1)
Sodium: 139 mmol/L (ref 135–145)
Total Bilirubin: 1.2 mg/dL (ref 0.3–1.2)
Total Protein: 7.7 g/dL (ref 6.5–8.1)

## 2019-11-13 LAB — ACETAMINOPHEN LEVEL: Acetaminophen (Tylenol), Serum: <10 ug/mL — ABNORMAL LOW (ref 10–30)

## 2019-11-13 LAB — SALICYLATE LEVEL: Salicylate Lvl: <7 mg/dL — ABNORMAL LOW (ref 7.0–30.0)

## 2019-11-13 MED ORDER — ACETAMINOPHEN 500 MG PO TABS
1000.0000 mg | ORAL_TABLET | Freq: Once | ORAL | Status: AC
  Administered 2019-11-13: 1000 mg via ORAL
  Filled 2019-11-13: qty 2
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
## 2019-11-13 NOTE — ED Notes (Signed)
Pt transported otf for imaging  
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
## 2019-12-03 ENCOUNTER — Ambulatory Visit: Payer: Medicaid Other

## 2020-02-12 ENCOUNTER — Other Ambulatory Visit: Payer: Self-pay

## 2020-02-12 DIAGNOSIS — F1210 Cannabis abuse, uncomplicated: Secondary | ICD-10-CM | POA: Insufficient documentation

## 2020-02-12 DIAGNOSIS — Z79899 Other long term (current) drug therapy: Secondary | ICD-10-CM | POA: Insufficient documentation

## 2020-02-12 DIAGNOSIS — K0889 Other specified disorders of teeth and supporting structures: Secondary | ICD-10-CM | POA: Insufficient documentation

## 2020-02-12 NOTE — ED Triage Notes (Signed)
Pt to the er for right side upper tooth pain possible broken tooth. Pt took tylenol at home.

## 2020-02-13 ENCOUNTER — Emergency Department
Admission: EM | Admit: 2020-02-13 | Discharge: 2020-02-13 | Disposition: A | Discharge status: Home / Self Care | Payer: Medicaid Other | Attending: Emergency Medicine | Admitting: Emergency Medicine

## 2020-02-13 DIAGNOSIS — K0889 Other specified disorders of teeth and supporting structures: Secondary | ICD-10-CM

## 2020-02-13 MED ORDER — OXYCODONE-ACETAMINOPHEN 5-325 MG PO TABS
2.0000 | ORAL_TABLET | Freq: Once | ORAL | Status: AC
  Administered 2020-02-13: 1 via ORAL
  Filled 2020-02-13: qty 2

## 2020-02-13 MED ORDER — METRONIDAZOLE 500 MG PO TABS: 500.0000 mg | ORAL_TABLET | Freq: Three times a day (TID) | ORAL | 0 refills | Status: AC

## 2020-02-13 MED ORDER — MAGIC MOUTHWASH W/LIDOCAINE: 5.0000 mL | Freq: Four times a day (QID) | ORAL | 0 refills | Status: DC | PRN

## 2020-02-13 MED ORDER — OXYCODONE-ACETAMINOPHEN 5-325 MG PO TABS: 2.0000 | ORAL_TABLET | Freq: Four times a day (QID) | ORAL | 0 refills | Status: DC | PRN

## 2020-02-13 MED ORDER — KETOROLAC TROMETHAMINE 30 MG/ML IJ SOLN
30.0000 mg | Freq: Once | INTRAMUSCULAR | Status: AC
  Administered 2020-02-13: 30 mg via INTRAMUSCULAR
  Filled 2020-02-13: qty 1

## 2020-02-13 MED ORDER — AMOXICILLIN 500 MG PO CAPS: 500.0000 mg | ORAL_CAPSULE | Freq: Three times a day (TID) | ORAL | 0 refills | Status: DC

## 2020-02-13 NOTE — Discharge Instructions (Addendum)
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
## 2020-02-15 ENCOUNTER — Encounter: Payer: Self-pay | Admitting: Emergency Medicine

## 2020-02-15 ENCOUNTER — Other Ambulatory Visit: Payer: Self-pay

## 2020-02-15 ENCOUNTER — Emergency Department
Admission: EM | Admit: 2020-02-15 | Discharge: 2020-02-15 | Disposition: A | Discharge status: Home / Self Care | Payer: Medicaid Other | Attending: Emergency Medicine | Admitting: Emergency Medicine

## 2020-02-15 ENCOUNTER — Telehealth: Payer: Self-pay | Admitting: Emergency Medicine

## 2020-02-15 DIAGNOSIS — R1010 Upper abdominal pain, unspecified: Secondary | ICD-10-CM | POA: Insufficient documentation

## 2020-02-15 DIAGNOSIS — R112 Nausea with vomiting, unspecified: Secondary | ICD-10-CM | POA: Insufficient documentation

## 2020-02-15 LAB — BASIC METABOLIC PANEL
Anion gap: 10 (ref 5–15)
Anion gap: 8 (ref 5–15)
BUN: 7 mg/dL (ref 6–20)
BUN: 7 mg/dL (ref 6–20)
CO2: 22 mmol/L (ref 22–32)
CO2: 23 mmol/L (ref 22–32)
Calcium: 8.6 mg/dL — ABNORMAL LOW (ref 8.9–10.3)
Calcium: 8.3 mg/dL — ABNORMAL LOW (ref 8.9–10.3)
Chloride: 105 mmol/L (ref 98–111)
Chloride: 107 mmol/L (ref 98–111)
Creatinine, Ser: 0.74 mg/dL (ref 0.44–1.00)
Creatinine, Ser: 0.62 mg/dL (ref 0.44–1.00)
GFR calc Af Amer: >60 mL/min (ref >60)
GFR calc Af Amer: >60 mL/min (ref >60)
GFR calc non Af Amer: >60 mL/min (ref >60)
GFR calc non Af Amer: >60 mL/min (ref >60)
Glucose, Bld: 90 mg/dL (ref 70–99)
Glucose, Bld: 86 mg/dL (ref 70–99)
Potassium: 5.5 mmol/L — ABNORMAL HIGH (ref 3.5–5.1)
Potassium: 4 mmol/L (ref 3.5–5.1)
Sodium: 137 mmol/L (ref 135–145)
Sodium: 138 mmol/L (ref 135–145)

## 2020-02-15 LAB — CBC WITH DIFFERENTIAL/PLATELET
Abs Immature Granulocytes: 0.01 10*3/uL (ref 0.00–0.07)
Basophils Absolute: 0 10*3/uL (ref 0.0–0.1)
Basophils Relative: 1 %
Eosinophils Absolute: 0 10*3/uL (ref 0.0–0.5)
Eosinophils Relative: 1 %
HCT: 43 % (ref 36.0–46.0)
Hemoglobin: 14.8 g/dL (ref 12.0–15.0)
Immature Granulocytes: 0 %
Lymphocytes Relative: 25 %
Lymphs Abs: 1.6 10*3/uL (ref 0.7–4.0)
MCH: 34.7 pg — ABNORMAL HIGH (ref 26.0–34.0)
MCHC: 34.4 g/dL (ref 30.0–36.0)
MCV: 100.9 fL — ABNORMAL HIGH (ref 80.0–100.0)
Monocytes Absolute: 0.4 10*3/uL (ref 0.1–1.0)
Monocytes Relative: 6 %
Neutro Abs: 4.5 10*3/uL (ref 1.7–7.7)
Neutrophils Relative %: 67 %
Platelets: 253 10*3/uL (ref 150–400)
RBC: 4.26 MIL/uL (ref 3.87–5.11)
RDW: 13 % (ref 11.5–15.5)
WBC: 6.6 10*3/uL (ref 4.0–10.5)
nRBC: 0 % (ref 0.0–0.2)

## 2020-02-15 LAB — POCT PREGNANCY, URINE: Preg Test, Ur: NEGATIVE

## 2020-02-15 MED ORDER — ONDANSETRON HCL 4 MG/2ML IJ SOLN
4.0000 mg | Freq: Once | INTRAMUSCULAR | Status: AC
  Administered 2020-02-15: 4 mg via INTRAVENOUS
  Filled 2020-02-15: qty 2

## 2020-02-15 MED ORDER — ONDANSETRON 4 MG PO TBDP: 4.0000 mg | ORAL_TABLET | Freq: Three times a day (TID) | ORAL | 0 refills | Status: DC | PRN

## 2020-02-15 MED ORDER — LACTATED RINGERS IV BOLUS
1000.0000 mL | Freq: Once | INTRAVENOUS | Status: AC
  Administered 2020-02-15: 1000 mL via INTRAVENOUS

## 2020-02-15 NOTE — ED Triage Notes (Signed)
Says she thinks she is allergic the the percocet we gave her the other day.  Says she cant keep things down now.  No percocet since yesterday am and says she still cant keep anyting down

## 2020-02-15 NOTE — ED Notes (Signed)
Green top recollected and sent to lab ?
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
## 2020-02-15 NOTE — Telephone Encounter (Signed)
Pt walked into ED requesting a note for work stating that she was here on 6/29. Note was printed and given to pt stating that she was seen in the ED on 6/29.

## 2020-03-20 ENCOUNTER — Ambulatory Visit: Payer: Medicaid Other

## 2020-04-24 ENCOUNTER — Emergency Department
Admission: EM | Admit: 2020-04-24 | Discharge: 2020-04-24 | Disposition: A | Discharge status: Home / Self Care | Payer: Medicaid Other | Attending: Emergency Medicine | Admitting: Emergency Medicine

## 2020-04-24 ENCOUNTER — Other Ambulatory Visit: Payer: Self-pay

## 2020-04-24 ENCOUNTER — Emergency Department: Payer: Medicaid Other

## 2020-04-24 ENCOUNTER — Encounter: Payer: Self-pay | Admitting: Emergency Medicine

## 2020-04-24 DIAGNOSIS — Y92838 Other recreation area as the place of occurrence of the external cause: Secondary | ICD-10-CM | POA: Insufficient documentation

## 2020-04-24 DIAGNOSIS — Y9339 Activity, other involving climbing, rappelling and jumping off: Secondary | ICD-10-CM | POA: Insufficient documentation

## 2020-04-24 DIAGNOSIS — Y998 Other external cause status: Secondary | ICD-10-CM | POA: Insufficient documentation

## 2020-04-24 DIAGNOSIS — W228XXA Striking against or struck by other objects, initial encounter: Secondary | ICD-10-CM | POA: Insufficient documentation

## 2020-04-24 DIAGNOSIS — Z7951 Long term (current) use of inhaled steroids: Secondary | ICD-10-CM | POA: Insufficient documentation

## 2020-04-24 DIAGNOSIS — S8011XA Contusion of right lower leg, initial encounter: Secondary | ICD-10-CM | POA: Insufficient documentation

## 2020-04-24 LAB — COMPREHENSIVE METABOLIC PANEL
ALT: 37 U/L (ref 0–44)
AST: 32 U/L (ref 15–41)
Albumin: 3.8 g/dL (ref 3.5–5.0)
Alkaline Phosphatase: 67 U/L (ref 38–126)
Anion gap: 8 (ref 5–15)
BUN: 8 mg/dL (ref 6–20)
CO2: 26 mmol/L (ref 22–32)
Calcium: 8.7 mg/dL — ABNORMAL LOW (ref 8.9–10.3)
Chloride: 105 mmol/L (ref 98–111)
Creatinine, Ser: 0.51 mg/dL (ref 0.44–1.00)
GFR calc Af Amer: >60 mL/min (ref >60)
GFR calc non Af Amer: >60 mL/min (ref >60)
Glucose, Bld: 89 mg/dL (ref 70–99)
Potassium: 3.7 mmol/L (ref 3.5–5.1)
Sodium: 139 mmol/L (ref 135–145)
Total Bilirubin: 1 mg/dL (ref 0.3–1.2)
Total Protein: 6.5 g/dL (ref 6.5–8.1)

## 2020-04-24 LAB — CBC WITH DIFFERENTIAL/PLATELET
Abs Immature Granulocytes: 0.02 10*3/uL (ref 0.00–0.07)
Basophils Absolute: 0.1 10*3/uL (ref 0.0–0.1)
Basophils Relative: 1 %
Eosinophils Absolute: 0.1 10*3/uL (ref 0.0–0.5)
Eosinophils Relative: 1 %
HCT: 34.5 % — ABNORMAL LOW (ref 36.0–46.0)
Hemoglobin: 12 g/dL (ref 12.0–15.0)
Immature Granulocytes: 0 %
Lymphocytes Relative: 29 %
Lymphs Abs: 2.1 10*3/uL (ref 0.7–4.0)
MCH: 34.6 pg — ABNORMAL HIGH (ref 26.0–34.0)
MCHC: 34.8 g/dL (ref 30.0–36.0)
MCV: 99.4 fL (ref 80.0–100.0)
Monocytes Absolute: 0.4 10*3/uL (ref 0.1–1.0)
Monocytes Relative: 6 %
Neutro Abs: 4.5 10*3/uL (ref 1.7–7.7)
Neutrophils Relative %: 63 %
Platelets: 256 10*3/uL (ref 150–400)
RBC: 3.47 MIL/uL — ABNORMAL LOW (ref 3.87–5.11)
RDW: 13.1 % (ref 11.5–15.5)
WBC: 7.1 10*3/uL (ref 4.0–10.5)
nRBC: 0 % (ref 0.0–0.2)

## 2020-04-24 LAB — TYPE AND SCREEN
ABO/RH(D): O POS
Antibody Screen: NEGATIVE

## 2020-04-24 LAB — PROTIME-INR
INR: 1 (ref 0.8–1.2)
Prothrombin Time: 12.5 seconds (ref 11.4–15.2)

## 2020-04-24 MED ORDER — MELOXICAM 7.5 MG PO TABS
7.5000 mg | ORAL_TABLET | Freq: Every day | ORAL | 0 refills | Status: DC
Start: 2020-04-24 — End: 2020-06-23
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
## 2020-04-24 NOTE — ED Notes (Signed)
See triage note  States she did a box jump about 1 week ago   Missed and hit her right lower leg  Swelling and bruising noted  States she noticed more bruising to ankle and foot

## 2020-04-24 NOTE — ED Triage Notes (Signed)
Pt reports was at the gym doing box jumps and she landed wrong and now has bruising to her right foot, ankle and shin

## 2020-04-24 NOTE — ED Notes (Signed)
Lab redraw complete.  Green, blue and pink top send to lab

## 2020-05-24 ENCOUNTER — Other Ambulatory Visit: Payer: Self-pay

## 2020-05-24 ENCOUNTER — Encounter: Payer: Self-pay | Admitting: Emergency Medicine

## 2020-05-24 ENCOUNTER — Emergency Department
Admission: EM | Admit: 2020-05-24 | Discharge: 2020-05-24 | Disposition: A | Discharge status: Home / Self Care | Payer: Medicaid Other | Attending: Emergency Medicine | Admitting: Emergency Medicine

## 2020-05-24 DIAGNOSIS — F1590 Other stimulant use, unspecified, uncomplicated: Secondary | ICD-10-CM | POA: Insufficient documentation

## 2020-05-24 DIAGNOSIS — L509 Urticaria, unspecified: Secondary | ICD-10-CM | POA: Insufficient documentation

## 2020-05-24 MED ORDER — METHYLPREDNISOLONE SODIUM SUCC 125 MG IJ SOLR
125.0000 mg | Freq: Once | INTRAMUSCULAR | Status: AC
  Administered 2020-05-24: 125 mg via INTRAVENOUS

## 2020-05-24 MED ORDER — METHYLPREDNISOLONE SODIUM SUCC 125 MG IJ SOLR
125.0000 mg | Freq: Once | INTRAMUSCULAR | Status: DC
  Filled 2020-05-24: qty 2

## 2020-05-24 MED ORDER — DIPHENHYDRAMINE HCL 50 MG/ML IJ SOLN
25.0000 mg | Freq: Once | INTRAMUSCULAR | Status: AC
  Administered 2020-05-24: 25 mg via INTRAVENOUS
  Filled 2020-05-24: qty 1

## 2020-05-24 MED ORDER — SODIUM CHLORIDE 0.9 % IV BOLUS
1000.0000 mL | Freq: Once | INTRAVENOUS | Status: AC
  Administered 2020-05-24: 1000 mL via INTRAVENOUS

## 2020-05-24 MED ORDER — HYDROXYZINE HCL 50 MG PO TABS
50.0000 mg | ORAL_TABLET | Freq: Three times a day (TID) | ORAL | 0 refills | Status: DC | PRN
Start: 2020-05-24 — End: 2020-06-23

## 2020-05-24 MED ORDER — FAMOTIDINE IN NACL 20-0.9 MG/50ML-% IV SOLN
20.0000 mg | Freq: Once | INTRAVENOUS | Status: AC
  Administered 2020-05-24: 20 mg via INTRAVENOUS
  Filled 2020-05-24: qty 50

## 2020-05-24 MED ORDER — METHYLPREDNISOLONE 4 MG PO TBPK
ORAL_TABLET | ORAL | 0 refills | Status: DC
Start: 2020-05-24 — End: 2020-06-23

## 2020-05-24 NOTE — Discharge Instructions (Signed)
Follow discharge care instruction take medication as directed.  If rash continue advised follow-up with the dermatology clinic listed in your discharge care instructions.

## 2020-05-24 NOTE — ED Triage Notes (Signed)
Pt to ED via POV c/o hives. Pt states that she noticed last night when she got home from work. Pt states that she did notice that she was itchy yesterday but did not noticed the hives until last night. Pt denies any new medications, lotions, or any other environmental changes. Pt is in NAD, pt denies trouble breathing.

## 2020-05-24 NOTE — ED Notes (Signed)
Pt c/o hives that started last night when she got off work, states worsened this morning upon awkakening. Pt with red rash noted to her arms/legs, and her abdomen. States +itching. Endorses recent change in fabric softener however is using same brand as before. Pt ambulatory without difficulty, respirations even and unlabored.
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
## 2020-06-23 ENCOUNTER — Other Ambulatory Visit: Payer: Self-pay

## 2020-06-23 ENCOUNTER — Encounter: Payer: Self-pay | Admitting: Medical Oncology

## 2020-06-23 ENCOUNTER — Emergency Department
Admission: EM | Admit: 2020-06-23 | Discharge: 2020-06-23 | Disposition: A | Discharge status: Home / Self Care | Payer: Medicaid Other | Attending: Emergency Medicine | Admitting: Emergency Medicine

## 2020-06-23 DIAGNOSIS — J069 Acute upper respiratory infection, unspecified: Secondary | ICD-10-CM | POA: Insufficient documentation

## 2020-06-23 DIAGNOSIS — Z20822 Contact with and (suspected) exposure to covid-19: Secondary | ICD-10-CM | POA: Insufficient documentation

## 2020-06-23 DIAGNOSIS — R112 Nausea with vomiting, unspecified: Secondary | ICD-10-CM | POA: Insufficient documentation

## 2020-06-23 LAB — RESPIRATORY PANEL BY RT PCR (FLU A&B, COVID)
Influenza A by PCR: NEGATIVE
Influenza B by PCR: NEGATIVE
SARS Coronavirus 2 by RT PCR: NEGATIVE

## 2020-06-23 MED ORDER — PREDNISONE 20 MG PO TABS
20.0000 mg | ORAL_TABLET | Freq: Two times a day (BID) | ORAL | 0 refills | Status: AC
Start: 2020-06-23 — End: 2020-06-28

## 2020-06-23 MED ORDER — ONDANSETRON 4 MG PO TBDP: 4.0000 mg | ORAL_TABLET | Freq: Three times a day (TID) | ORAL | 0 refills | Status: DC | PRN
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
## 2020-06-23 NOTE — ED Notes (Signed)
E-signature not working at this time. Pt verbalized understanding of D/C instructions, prescriptions and follow up care with no further questions at this time. Pt in NAD and ambulatory at time of D/C.  

## 2020-06-23 NOTE — ED Triage Notes (Signed)
Pt reports that she has not been feeling well the past couple days, states that she has been having congestion and intermittent nausea with a couple episodes of vomiting. Pt reports that she has been having just generalized body aches also. 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

## 2020-06-23 NOTE — Discharge Instructions (Signed)
Your Covid test was negative today. Continue to monitor symptoms, rest, and hydrate. Take the prescription meds as directed. Follow-up with [facility] or return if needed.

## 2020-11-15 ENCOUNTER — Telehealth: Payer: Self-pay | Admitting: Emergency Medicine

## 2020-11-15 ENCOUNTER — Emergency Department
Admission: EM | Admit: 2020-11-15 | Discharge: 2020-11-15 | Disposition: A | Discharge status: Home / Self Care | Payer: Medicaid Other | Attending: Emergency Medicine | Admitting: Emergency Medicine

## 2020-11-15 ENCOUNTER — Encounter: Payer: Self-pay | Admitting: Emergency Medicine

## 2020-11-15 ENCOUNTER — Other Ambulatory Visit: Payer: Self-pay

## 2020-11-15 DIAGNOSIS — K2901 Acute gastritis with bleeding: Secondary | ICD-10-CM | POA: Insufficient documentation

## 2020-11-15 DIAGNOSIS — K226 Gastro-esophageal laceration-hemorrhage syndrome: Secondary | ICD-10-CM | POA: Insufficient documentation

## 2020-11-15 DIAGNOSIS — R112 Nausea with vomiting, unspecified: Secondary | ICD-10-CM

## 2020-11-15 LAB — CBC
HCT: 42.9 % (ref 36.0–46.0)
Hemoglobin: 14.2 g/dL (ref 12.0–15.0)
MCH: 32.9 pg (ref 26.0–34.0)
MCHC: 33.1 g/dL (ref 30.0–36.0)
MCV: 99.5 fL (ref 80.0–100.0)
Platelets: 251 10*3/uL (ref 150–400)
RBC: 4.31 MIL/uL (ref 3.87–5.11)
RDW: 12.8 % (ref 11.5–15.5)
WBC: 7.7 10*3/uL (ref 4.0–10.5)
nRBC: 0 % (ref 0.0–0.2)

## 2020-11-15 LAB — URINALYSIS, COMPLETE (UACMP) WITH MICROSCOPIC
Bacteria, UA: NONE SEEN
Bilirubin Urine: NEGATIVE
Glucose, UA: NEGATIVE mg/dL
Ketones, ur: NEGATIVE mg/dL
Leukocytes,Ua: NEGATIVE
Nitrite: NEGATIVE
Protein, ur: NEGATIVE mg/dL
Specific Gravity, Urine: 1.011 (ref 1.005–1.030)
pH: 8 (ref 5.0–8.0)

## 2020-11-15 LAB — COMPREHENSIVE METABOLIC PANEL
ALT: 15 U/L (ref 0–44)
AST: 22 U/L (ref 15–41)
Albumin: 3.7 g/dL (ref 3.5–5.0)
Alkaline Phosphatase: 75 U/L (ref 38–126)
Anion gap: 8 (ref 5–15)
BUN: 11 mg/dL (ref 6–20)
CO2: 21 mmol/L — ABNORMAL LOW (ref 22–32)
Calcium: 8.6 mg/dL — ABNORMAL LOW (ref 8.9–10.3)
Chloride: 104 mmol/L (ref 98–111)
Creatinine, Ser: 0.58 mg/dL (ref 0.44–1.00)
GFR, Estimated: >60 mL/min (ref >60)
Glucose, Bld: 101 mg/dL — ABNORMAL HIGH (ref 70–99)
Potassium: 4.4 mmol/L (ref 3.5–5.1)
Sodium: 133 mmol/L — ABNORMAL LOW (ref 135–145)
Total Bilirubin: 0.7 mg/dL (ref 0.3–1.2)
Total Protein: 6.8 g/dL (ref 6.5–8.1)

## 2020-11-15 LAB — POC URINE PREG, ED: Preg Test, Ur: NEGATIVE

## 2020-11-15 LAB — LIPASE, BLOOD: Lipase: 31 U/L (ref 11–51)

## 2020-11-15 MED ORDER — ONDANSETRON 4 MG PO TBDP
4.0000 mg | ORAL_TABLET | Freq: Once | ORAL | Status: DC
  Filled 2020-11-15: qty 1

## 2020-11-15 MED ORDER — HYDROCODONE-ACETAMINOPHEN 5-325 MG PO TABS: 1.0000 | ORAL_TABLET | ORAL | 0 refills | Status: DC | PRN

## 2020-11-15 MED ORDER — FENTANYL CITRATE (PF) 100 MCG/2ML IJ SOLN
50.0000 ug | Freq: Once | INTRAMUSCULAR | Status: DC
  Filled 2020-11-15: qty 2

## 2020-11-15 MED ORDER — HYDROCODONE-ACETAMINOPHEN 5-325 MG PO TABS: 1.0000 | ORAL_TABLET | Freq: Four times a day (QID) | ORAL | 0 refills | Status: DC | PRN

## 2020-11-15 MED ORDER — PANTOPRAZOLE SODIUM 40 MG PO TBEC: 40.0000 mg | DELAYED_RELEASE_TABLET | Freq: Every day | ORAL | 1 refills | Status: DC

## 2020-11-15 MED ORDER — ONDANSETRON HCL 4 MG/2ML IJ SOLN
4.0000 mg | Freq: Once | INTRAMUSCULAR | Status: AC
  Administered 2020-11-15: 4 mg via INTRAVENOUS
  Filled 2020-11-15: qty 2

## 2020-11-15 MED ORDER — HYDROMORPHONE HCL 1 MG/ML IJ SOLN
0.5000 mg | Freq: Once | INTRAMUSCULAR | Status: AC
Start: 2020-11-15 — End: 2020-11-15
  Administered 2020-11-15: 0.5 mg via INTRAVENOUS
  Filled 2020-11-15: qty 1

## 2020-11-15 MED ORDER — SUCRALFATE 1 G PO TABS: 1.0000 g | ORAL_TABLET | Freq: Three times a day (TID) | ORAL | 0 refills | Status: DC

## 2020-11-15 MED ORDER — ONDANSETRON 4 MG PO TBDP: 4.0000 mg | ORAL_TABLET | Freq: Three times a day (TID) | ORAL | 0 refills | Status: DC | PRN

## 2020-11-15 MED ORDER — SODIUM CHLORIDE 0.9 % IV BOLUS
1000.0000 mL | Freq: Once | INTRAVENOUS | Status: AC
  Administered 2020-11-15: 1000 mL via INTRAVENOUS

## 2020-11-15 NOTE — ED Notes (Signed)
Instructed pt to press call bell when she is able to give urine sample.

## 2020-11-15 NOTE — ED Triage Notes (Signed)
Pt reports last pm started with abd pain that starts up top and moves down to the bottom and is sharp in nature. Pt reports pain is intermittent and she feels drained of energy. Pt reports blood in vomit is bright red in color and a good amount of blood

## 2020-11-15 NOTE — Telephone Encounter (Signed)
Patient was seen for gastritis by Dr. Lenard Lance my partner.  Pharmacy reports having some difficulty processing his DEA number.  Dr. Lenard Lance had previously discussed with the patient treatment plan, use of Norco, as well as its risks and benefits.  Reviewed prescriber database personally.  Will provide brief prescription due to dispensing difficulty at pharmacy under Dr. Awanda Mink DEA at this time.
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
## 2020-11-15 NOTE — ED Notes (Signed)
Pt refused Fentanyl r/t a previous experience. Dr. Lenard Lance, MD made aware.

## 2020-11-15 NOTE — ED Notes (Addendum)
Pt presented to the ED with abdominal pain, pt states that the pain started around Thursday night. Pt reports throwing up bright red blood and nausea associated with the pain last night. Pt's reports LMP 2 weeks ago. When palpating Pt's abdomen, pt reported generalized pain and epigastric area. Pt reports pain at a 8 and NAD.

## 2020-11-17 ENCOUNTER — Other Ambulatory Visit: Payer: Self-pay | Admitting: Family Medicine

## 2020-11-17 DIAGNOSIS — Z30011 Encounter for initial prescription of contraceptive pills: Secondary | ICD-10-CM

## 2020-11-17 NOTE — Telephone Encounter (Signed)
Per chart review, patient with last RP visit at [facility] 03/2018 and last in-person visit in 06/2019.  Patient has not been seen in over 12 months and will need an in-person visit prior to any refills on OCs.

## 2020-11-17 NOTE — Telephone Encounter (Signed)
Per chart review, patient with last RP visit in 03/2018 and last visit to [facility] in 06/2019.  Patient has not been here in over 12 months and needs an in-person visit prior to any refills.

## 2020-11-24 ENCOUNTER — Ambulatory Visit: Payer: Medicaid Other

## 2020-12-05 ENCOUNTER — Emergency Department
Admission: EM | Admit: 2020-12-05 | Discharge: 2020-12-05 | Disposition: A | Discharge status: Home / Self Care | Payer: Self-pay | Attending: Emergency Medicine | Admitting: Emergency Medicine

## 2020-12-05 ENCOUNTER — Other Ambulatory Visit: Payer: Self-pay

## 2020-12-05 ENCOUNTER — Emergency Department: Payer: Self-pay

## 2020-12-05 DIAGNOSIS — Z28310 Unvaccinated for covid-19: Secondary | ICD-10-CM | POA: Insufficient documentation

## 2020-12-05 DIAGNOSIS — K529 Noninfective gastroenteritis and colitis, unspecified: Secondary | ICD-10-CM | POA: Insufficient documentation

## 2020-12-05 DIAGNOSIS — Z20822 Contact with and (suspected) exposure to covid-19: Secondary | ICD-10-CM | POA: Insufficient documentation

## 2020-12-05 LAB — URINALYSIS, COMPLETE (UACMP) WITH MICROSCOPIC
Bacteria, UA: NONE SEEN
Bilirubin Urine: NEGATIVE
Glucose, UA: NEGATIVE mg/dL
Ketones, ur: NEGATIVE mg/dL
Leukocytes,Ua: NEGATIVE
Nitrite: NEGATIVE
Protein, ur: NEGATIVE mg/dL
Specific Gravity, Urine: 1.009 (ref 1.005–1.030)
pH: 7 (ref 5.0–8.0)

## 2020-12-05 LAB — COMPREHENSIVE METABOLIC PANEL
ALT: 23 U/L (ref 0–44)
AST: 29 U/L (ref 15–41)
Albumin: 4 g/dL (ref 3.5–5.0)
Alkaline Phosphatase: 81 U/L (ref 38–126)
Anion gap: 10 (ref 5–15)
BUN: 9 mg/dL (ref 6–20)
CO2: 23 mmol/L (ref 22–32)
Calcium: 9.1 mg/dL (ref 8.9–10.3)
Chloride: 103 mmol/L (ref 98–111)
Creatinine, Ser: 0.54 mg/dL (ref 0.44–1.00)
GFR, Estimated: >60 mL/min (ref >60)
Glucose, Bld: 98 mg/dL (ref 70–99)
Potassium: 3.7 mmol/L (ref 3.5–5.1)
Sodium: 136 mmol/L (ref 135–145)
Total Bilirubin: 0.8 mg/dL (ref 0.3–1.2)
Total Protein: 7.1 g/dL (ref 6.5–8.1)

## 2020-12-05 LAB — CBC
HCT: 41.1 % (ref 36.0–46.0)
Hemoglobin: 13.9 g/dL (ref 12.0–15.0)
MCH: 33.1 pg (ref 26.0–34.0)
MCHC: 33.8 g/dL (ref 30.0–36.0)
MCV: 97.9 fL (ref 80.0–100.0)
Platelets: 222 10*3/uL (ref 150–400)
RBC: 4.2 MIL/uL (ref 3.87–5.11)
RDW: 12.7 % (ref 11.5–15.5)
WBC: 5.9 10*3/uL (ref 4.0–10.5)
nRBC: 0 % (ref 0.0–0.2)

## 2020-12-05 LAB — LIPASE, BLOOD: Lipase: 29 U/L (ref 11–51)

## 2020-12-05 LAB — CK: Total CK: 74 U/L (ref 38–234)

## 2020-12-05 LAB — RESP PANEL BY RT-PCR (FLU A&B, COVID) ARPGX2
Influenza A by PCR: NEGATIVE
Influenza B by PCR: NEGATIVE
SARS Coronavirus 2 by RT PCR: NEGATIVE

## 2020-12-05 LAB — POC URINE PREG, ED: Preg Test, Ur: NEGATIVE

## 2020-12-05 MED ORDER — SODIUM CHLORIDE 0.9 % IV BOLUS
1000.0000 mL | Freq: Once | INTRAVENOUS | Status: AC
  Administered 2020-12-05: 1000 mL via INTRAVENOUS

## 2020-12-05 MED ORDER — ONDANSETRON HCL 4 MG/2ML IJ SOLN
4.0000 mg | Freq: Once | INTRAMUSCULAR | Status: AC
  Administered 2020-12-05: 4 mg via INTRAVENOUS
  Filled 2020-12-05: qty 2

## 2020-12-05 MED ORDER — ONDANSETRON 4 MG PO TBDP: 4.0000 mg | ORAL_TABLET | Freq: Three times a day (TID) | ORAL | 0 refills | Status: DC | PRN
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
## 2020-12-05 NOTE — ED Notes (Signed)
Pt given ginger ale for PO challenge. Resting comfortably in bed, denies needs at this time. Bed locked in low position with side rails up x2, call light in reach.

## 2020-12-05 NOTE — Discharge Instructions (Addendum)
Your work-up was reassuring if you develop pain in your abdomen mostly in the right lower quadrant we need you to return to the ER for further work-up but at this time I suspect that this is most likely a viral illness.  We have prescribed you some Zofran to help with your symptoms

## 2020-12-05 NOTE — ED Triage Notes (Signed)
Pt to ED for fever and emesis x2 days. Emesis x3 in past 24 hours. Has been taking zofran with relief. Afebrile on arrival.  +diarrhea  Reports mid abdominal pain with emesis, "does not hurt unless I am getting sick" Pt in NAD at this time  Sig pad not available at this time, pt verbalizes understanding and agreement of MSE waiver

## 2021-01-14 ENCOUNTER — Other Ambulatory Visit: Payer: Self-pay

## 2021-01-14 ENCOUNTER — Encounter: Payer: Self-pay | Admitting: Emergency Medicine

## 2021-01-14 ENCOUNTER — Emergency Department
Admission: EM | Admit: 2021-01-14 | Discharge: 2021-01-14 | Disposition: A | Discharge status: Home / Self Care | Payer: Medicaid Other | Attending: Emergency Medicine | Admitting: Emergency Medicine

## 2021-01-14 DIAGNOSIS — Z20822 Contact with and (suspected) exposure to covid-19: Secondary | ICD-10-CM | POA: Insufficient documentation

## 2021-01-14 DIAGNOSIS — B349 Viral infection, unspecified: Secondary | ICD-10-CM | POA: Insufficient documentation

## 2021-01-14 DIAGNOSIS — U071 COVID-19: Secondary | ICD-10-CM

## 2021-01-14 LAB — POC URINE PREG, ED: Preg Test, Ur: NEGATIVE

## 2021-01-14 LAB — CBC
HCT: 41.2 % (ref 36.0–46.0)
Hemoglobin: 14.3 g/dL (ref 12.0–15.0)
MCH: 34.2 pg — ABNORMAL HIGH (ref 26.0–34.0)
MCHC: 34.7 g/dL (ref 30.0–36.0)
MCV: 98.6 fL (ref 80.0–100.0)
Platelets: 291 10*3/uL (ref 150–400)
RBC: 4.18 MIL/uL (ref 3.87–5.11)
RDW: 13.3 % (ref 11.5–15.5)
WBC: 8.4 10*3/uL (ref 4.0–10.5)
nRBC: 0 % (ref 0.0–0.2)

## 2021-01-14 LAB — LIPASE, BLOOD: Lipase: 28 U/L (ref 11–51)

## 2021-01-14 LAB — URINALYSIS, COMPLETE (UACMP) WITH MICROSCOPIC
Bacteria, UA: NONE SEEN
Bilirubin Urine: NEGATIVE
Glucose, UA: NEGATIVE mg/dL
Hgb urine dipstick: NEGATIVE
Ketones, ur: NEGATIVE mg/dL
Leukocytes,Ua: NEGATIVE
Nitrite: NEGATIVE
Protein, ur: 30 mg/dL — AB
Specific Gravity, Urine: 1.024 (ref 1.005–1.030)
pH: 9 — ABNORMAL HIGH (ref 5.0–8.0)

## 2021-01-14 LAB — COMPREHENSIVE METABOLIC PANEL
ALT: 21 U/L (ref 0–44)
AST: 31 U/L (ref 15–41)
Albumin: 4.1 g/dL (ref 3.5–5.0)
Alkaline Phosphatase: 91 U/L (ref 38–126)
Anion gap: 11 (ref 5–15)
BUN: 12 mg/dL (ref 6–20)
CO2: 24 mmol/L (ref 22–32)
Calcium: 8.6 mg/dL — ABNORMAL LOW (ref 8.9–10.3)
Chloride: 103 mmol/L (ref 98–111)
Creatinine, Ser: 0.56 mg/dL (ref 0.44–1.00)
GFR, Estimated: >60 mL/min (ref >60)
Glucose, Bld: 97 mg/dL (ref 70–99)
Potassium: 3.9 mmol/L (ref 3.5–5.1)
Sodium: 138 mmol/L (ref 135–145)
Total Bilirubin: 0.5 mg/dL (ref 0.3–1.2)
Total Protein: 7.4 g/dL (ref 6.5–8.1)

## 2021-01-14 LAB — SARS CORONAVIRUS 2 (TAT 6-24 HRS): SARS Coronavirus 2: NEGATIVE

## 2021-01-14 MED ORDER — ONDANSETRON 4 MG PO TBDP
4.0000 mg | ORAL_TABLET | Freq: Once | ORAL | Status: AC
  Administered 2021-01-14: 4 mg via ORAL
  Filled 2021-01-14: qty 1

## 2021-01-14 MED ORDER — NAPROXEN 500 MG PO TABS
500.0000 mg | ORAL_TABLET | Freq: Once | ORAL | Status: AC
  Administered 2021-01-14: 500 mg via ORAL
  Filled 2021-01-14: qty 1
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
## 2021-01-14 NOTE — ED Triage Notes (Signed)
Pt arrives via [facility] with complaint of emesis, pt claims frequent covid positives at work. Complain of abdominal pain, nausea, vomiting. Pt has had interactions with flu and covid. Pt currently ambulatory to triage, with even and unlabored respirations, and NAD

## 2021-01-14 NOTE — Discharge Instructions (Signed)
As we discussed, I suspect a viral syndrome like COVID-19 causing your symptoms.  Check your MyChart results later this afternoon for your COVID test result.  Use the Zofran that you have at home for further nausea and vomiting.  Focus on liquids and staying hydrated.  Use Tylenol for pain and fevers.  Up to 1000 mg per dose, up to 4 times per day.  Do not take more than 4000 mg of Tylenol/acetaminophen within 24 hours.. Use naproxen/Aleve for anti-inflammatory pain relief. Use up to 500mg  every 12 hours. Do not take more frequently than this. Do not use other NSAIDs (ibuprofen, Advil) while taking this medication. It is safe to take Tylenol with this.

## 2021-03-27 ENCOUNTER — Other Ambulatory Visit: Payer: Self-pay

## 2021-03-27 ENCOUNTER — Emergency Department
Admission: EM | Admit: 2021-03-27 | Discharge: 2021-03-27 | Disposition: A | Discharge status: Home / Self Care | Payer: Medicaid Other | Attending: Emergency Medicine | Admitting: Emergency Medicine

## 2021-03-27 DIAGNOSIS — X500XXA Overexertion from strenuous movement or load, initial encounter: Secondary | ICD-10-CM | POA: Insufficient documentation

## 2021-03-27 DIAGNOSIS — S39012A Strain of muscle, fascia and tendon of lower back, initial encounter: Secondary | ICD-10-CM | POA: Insufficient documentation

## 2021-03-27 DIAGNOSIS — M545 Low back pain, unspecified: Secondary | ICD-10-CM

## 2021-03-27 MED ORDER — BACLOFEN 10 MG PO TABS: 10.0000 mg | ORAL_TABLET | Freq: Three times a day (TID) | ORAL | 0 refills | Status: AC

## 2021-03-27 MED ORDER — CYCLOBENZAPRINE HCL 10 MG PO TABS
10.0000 mg | ORAL_TABLET | Freq: Once | ORAL | Status: AC
  Administered 2021-03-27: 10 mg via ORAL
  Filled 2021-03-27: qty 1

## 2021-03-27 MED ORDER — KETOROLAC TROMETHAMINE 60 MG/2ML IM SOLN
60.0000 mg | Freq: Once | INTRAMUSCULAR | Status: AC
  Administered 2021-03-27: 60 mg via INTRAMUSCULAR
  Filled 2021-03-27: qty 2

## 2021-03-27 MED ORDER — PREDNISONE 10 MG (21) PO TBPK: ORAL_TABLET | ORAL | 0 refills | Status: DC

## 2021-03-27 NOTE — ED Triage Notes (Signed)
Pt here with back pain after picking up her large dog. Pt states pain is on the right side of her lower back.
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
## 2021-03-27 NOTE — Discharge Instructions (Addendum)
Follow-up with your regular doctor if not improving in 2 to 3 days.  Return emergency department worsening.  Use medications as prescribed.  Use ice.

## 2021-03-27 NOTE — ED Notes (Signed)
See triage note  Presents with lower back pain  States she developed pin after lifting her heavy dog  Pain is mainly on the right  Ambulates well

## 2021-06-09 ENCOUNTER — Emergency Department
Admission: EM | Admit: 2021-06-09 | Discharge: 2021-06-09 | Disposition: A | Discharge status: Home / Self Care | Payer: Medicaid Other | Attending: Emergency Medicine | Admitting: Emergency Medicine

## 2021-06-09 ENCOUNTER — Other Ambulatory Visit: Payer: Self-pay

## 2021-06-09 ENCOUNTER — Emergency Department: Payer: Medicaid Other

## 2021-06-09 DIAGNOSIS — W540XXA Bitten by dog, initial encounter: Secondary | ICD-10-CM | POA: Insufficient documentation

## 2021-06-09 DIAGNOSIS — S61412A Laceration without foreign body of left hand, initial encounter: Secondary | ICD-10-CM | POA: Insufficient documentation

## 2021-06-09 MED ORDER — AMOXICILLIN-POT CLAVULANATE 875-125 MG PO TABS: 1.0000 | ORAL_TABLET | Freq: Two times a day (BID) | ORAL | 0 refills | Status: AC

## 2021-06-09 MED ORDER — AMOXICILLIN-POT CLAVULANATE 875-125 MG PO TABS
1.0000 | ORAL_TABLET | Freq: Once | ORAL | Status: AC
  Administered 2021-06-09: 1 via ORAL
  Filled 2021-06-09: qty 1

## 2021-06-09 NOTE — ED Notes (Signed)
Xray at BS 
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
## 2021-06-11 ENCOUNTER — Encounter: Payer: Self-pay | Admitting: Emergency Medicine

## 2021-06-11 ENCOUNTER — Emergency Department
Admission: EM | Admit: 2021-06-11 | Discharge: 2021-06-11 | Disposition: A | Discharge status: Home / Self Care | Payer: Medicaid Other | Attending: Emergency Medicine | Admitting: Emergency Medicine

## 2021-06-11 ENCOUNTER — Other Ambulatory Visit: Payer: Self-pay

## 2021-06-11 DIAGNOSIS — W540XXA Bitten by dog, initial encounter: Secondary | ICD-10-CM | POA: Insufficient documentation

## 2021-06-11 DIAGNOSIS — S61402A Unspecified open wound of left hand, initial encounter: Secondary | ICD-10-CM | POA: Insufficient documentation

## 2021-06-11 DIAGNOSIS — S61452A Open bite of left hand, initial encounter: Secondary | ICD-10-CM

## 2021-06-11 NOTE — ED Triage Notes (Signed)
Pt reports her dog bit her left hand Monday,she was seen here but not given abx and now she thinks it is infected. Pt dogs vaccines are up to date
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
## 2021-06-23 ENCOUNTER — Encounter: Payer: Self-pay | Admitting: Emergency Medicine

## 2021-06-23 ENCOUNTER — Other Ambulatory Visit: Payer: Self-pay

## 2021-06-23 ENCOUNTER — Emergency Department
Admission: EM | Admit: 2021-06-23 | Discharge: 2021-06-23 | Disposition: A | Discharge status: Home / Self Care | Payer: Medicaid Other | Attending: Emergency Medicine | Admitting: Emergency Medicine

## 2021-06-23 ENCOUNTER — Emergency Department: Payer: Medicaid Other

## 2021-06-23 DIAGNOSIS — Z20822 Contact with and (suspected) exposure to covid-19: Secondary | ICD-10-CM | POA: Insufficient documentation

## 2021-06-23 DIAGNOSIS — R630 Anorexia: Secondary | ICD-10-CM | POA: Insufficient documentation

## 2021-06-23 DIAGNOSIS — R112 Nausea with vomiting, unspecified: Secondary | ICD-10-CM | POA: Insufficient documentation

## 2021-06-23 DIAGNOSIS — R000 Tachycardia, unspecified: Secondary | ICD-10-CM | POA: Insufficient documentation

## 2021-06-23 DIAGNOSIS — R079 Chest pain, unspecified: Secondary | ICD-10-CM

## 2021-06-23 DIAGNOSIS — J069 Acute upper respiratory infection, unspecified: Secondary | ICD-10-CM | POA: Insufficient documentation

## 2021-06-23 LAB — BASIC METABOLIC PANEL
Anion gap: 10 (ref 5–15)
BUN: 8 mg/dL (ref 6–20)
CO2: 24 mmol/L (ref 22–32)
Calcium: 9.3 mg/dL (ref 8.9–10.3)
Chloride: 102 mmol/L (ref 98–111)
Creatinine, Ser: 0.71 mg/dL (ref 0.44–1.00)
GFR, Estimated: >60 mL/min (ref >60)
Glucose, Bld: 117 mg/dL — ABNORMAL HIGH (ref 70–99)
Potassium: 3.7 mmol/L (ref 3.5–5.1)
Sodium: 136 mmol/L (ref 135–145)

## 2021-06-23 LAB — CBC
HCT: 43.3 % (ref 36.0–46.0)
Hemoglobin: 14.7 g/dL (ref 12.0–15.0)
MCH: 33.9 pg (ref 26.0–34.0)
MCHC: 33.9 g/dL (ref 30.0–36.0)
MCV: 99.8 fL (ref 80.0–100.0)
Platelets: 257 10*3/uL (ref 150–400)
RBC: 4.34 MIL/uL (ref 3.87–5.11)
RDW: 12.4 % (ref 11.5–15.5)
WBC: 7.4 10*3/uL (ref 4.0–10.5)
nRBC: 0 % (ref 0.0–0.2)

## 2021-06-23 LAB — TROPONIN I (HIGH SENSITIVITY): Troponin I (High Sensitivity): 2 ng/L (ref <18)

## 2021-06-23 LAB — RESP PANEL BY RT-PCR (FLU A&B, COVID) ARPGX2
Influenza A by PCR: NEGATIVE
Influenza B by PCR: NEGATIVE
SARS Coronavirus 2 by RT PCR: NEGATIVE

## 2021-06-23 LAB — POC URINE PREG, ED: Preg Test, Ur: NEGATIVE

## 2021-06-23 MED ORDER — ONDANSETRON 4 MG PO TBDP
4.0000 mg | ORAL_TABLET | Freq: Once | ORAL | Status: AC
  Administered 2021-06-23: 4 mg via ORAL
  Filled 2021-06-23: qty 1

## 2021-06-23 MED ORDER — IBUPROFEN 400 MG PO TABS
400.0000 mg | ORAL_TABLET | Freq: Once | ORAL | Status: AC
  Administered 2021-06-23: 400 mg via ORAL
  Filled 2021-06-23: qty 1

## 2021-06-23 MED ORDER — ONDANSETRON HCL 4 MG PO TABS: 4.0000 mg | ORAL_TABLET | Freq: Three times a day (TID) | ORAL | 0 refills | Status: DC | PRN

## 2021-06-23 MED ORDER — BENZONATATE 100 MG PO CAPS: 100.0000 mg | ORAL_CAPSULE | Freq: Three times a day (TID) | ORAL | 0 refills | Status: DC | PRN
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
## 2021-06-23 NOTE — ED Triage Notes (Signed)
Pt states fever x 2 days, increased fatigue, and substernal CP that started earlier today. Pt states increased pain with coughing, pt with noted cough in triage, and with deep inspiration. Pt A&O x4, NAD noted at this time.  Pt states recent exposure to Covid + person who was close exposure to flu + person.

## 2021-10-05 ENCOUNTER — Emergency Department: Payer: Medicaid Other

## 2021-10-05 ENCOUNTER — Emergency Department
Admission: EM | Admit: 2021-10-05 | Discharge: 2021-10-05 | Disposition: A | Discharge status: Home / Self Care | Payer: Medicaid Other | Attending: Emergency Medicine | Admitting: Emergency Medicine

## 2021-10-05 ENCOUNTER — Other Ambulatory Visit: Payer: Self-pay

## 2021-10-05 ENCOUNTER — Encounter: Payer: Self-pay | Admitting: Emergency Medicine

## 2021-10-05 DIAGNOSIS — Z28310 Unvaccinated for covid-19: Secondary | ICD-10-CM | POA: Insufficient documentation

## 2021-10-05 DIAGNOSIS — F17200 Nicotine dependence, unspecified, uncomplicated: Secondary | ICD-10-CM | POA: Insufficient documentation

## 2021-10-05 DIAGNOSIS — Z20822 Contact with and (suspected) exposure to covid-19: Secondary | ICD-10-CM | POA: Insufficient documentation

## 2021-10-05 DIAGNOSIS — J069 Acute upper respiratory infection, unspecified: Secondary | ICD-10-CM

## 2021-10-05 DIAGNOSIS — R112 Nausea with vomiting, unspecified: Secondary | ICD-10-CM | POA: Insufficient documentation

## 2021-10-05 DIAGNOSIS — R030 Elevated blood-pressure reading, without diagnosis of hypertension: Secondary | ICD-10-CM

## 2021-10-05 LAB — RESP PANEL BY RT-PCR (FLU A&B, COVID) ARPGX2
Influenza A by PCR: NEGATIVE
Influenza B by PCR: NEGATIVE
SARS Coronavirus 2 by RT PCR: NEGATIVE

## 2021-10-05 MED ORDER — PSEUDOEPH-BROMPHEN-DM 30-2-10 MG/5ML PO SYRP: 5.0000 mL | ORAL_SOLUTION | Freq: Four times a day (QID) | ORAL | 0 refills | Status: DC | PRN

## 2021-10-05 MED ORDER — ONDANSETRON 4 MG PO TBDP: 4.0000 mg | ORAL_TABLET | Freq: Three times a day (TID) | ORAL | 0 refills | Status: DC | PRN

## 2021-10-05 MED ORDER — ONDANSETRON 4 MG PO TBDP
4.0000 mg | ORAL_TABLET | Freq: Once | ORAL | Status: AC
  Administered 2021-10-05: 4 mg via ORAL
  Filled 2021-10-05: qty 1

## 2021-10-05 NOTE — ED Provider Notes (Addendum)
[facility] Provider Note    Event Date/Time   First MD Initiated Contact with Patient 10/05/21 [phone]     (approximate)   History   Nasal Congestion, Cough, and Headache   HPI  Cheyenne Barnes is a 26 y.o. female   presents to the ED with complaint of 5 days of upper respiratory symptoms along with nausea and vomiting.  Patient states there is also been a fever.  Patient has a history of ITP, thrombocytopenia.  She also admits to smoking weed.  She did not get the COVID-vaccine and is here with her sister who also has similar symptoms.   Physical Exam   Triage Vital Signs: ED Triage Vitals  Enc Vitals Group     BP 10/05/21 0845 (!) 135/103     Pulse Rate 10/05/21 0845 99     Resp 10/05/21 0845 16     Temp 10/05/21 0845 98.5 F (36.9 C)     Temp Source 10/05/21 0845 Oral     SpO2 10/05/21 0845 97 %     Weight 10/05/21 0840 240 lb (108.9 kg)     Height 10/05/21 0840 5\' 3"  (1.6 m)     Head Circumference --      Peak Flow --      Pain Score 10/05/21 0840 0     Pain Loc --      Pain Edu? --      Excl. in GC? --     Most recent vital signs: Vitals:   10/05/21 0845 10/05/21 0950  BP: (!) 135/103 (!) 143/102  Pulse: 99   Resp: 16   Temp: 98.5 F (36.9 C)   SpO2: 97%      General: Awake, no distress.  Alert and able to to talk in complete sentences without any difficulty. CV:  Good peripheral perfusion.  Heart regular rate and rhythm without murmur. Resp:  Normal effort.  Lungs are clear bilaterally. Abd:  No distention.     ED Results / Procedures / Treatments   Labs (all labs ordered are listed, but only abnormal results are displayed) Labs Reviewed  RESP PANEL BY RT-PCR (FLU A&B, COVID) ARPGX2      RADIOLOGY  Chest x-ray two-view was reviewed by myself and no infiltrate was noted.  Chest x-ray radiology report is negative for acute cardiopulmonary disease.   PROCEDURES:  Critical Care performed:    Procedures   MEDICATIONS ORDERED IN ED: Medications  ondansetron (ZOFRAN-ODT) disintegrating tablet 4 mg (4 mg Oral Given 10/05/21 0917)     IMPRESSION / MDM / ASSESSMENT AND PLAN / ED COURSE  I reviewed the triage vital signs and the nursing notes.   Differential diagnosis includes, but is not limited to, COVID, influenza, viral upper respiratory infection.  Elevated blood pressure, undiagnosed hypertension.  26 year old female presents to the ED with complaint of upper respiratory infection, nausea and vomiting for the last 5 days.  Patient has a history of ITP and thrombocytopenia.  Patient admits to smoking weed.  Her sister is being seen today as well with similar symptoms.  COVID and influenza test were negative.  Chest x-ray was negative for any acute processes.  Patient was reassured.  While she was in the emergency department a Zofran was given for nausea.  She did not have any continued GI related symptoms while in the ED.  A prescription for Bromfed and Zofran was sent to her pharmacy.  A note for work.  She is encouraged  to increase fluids to stay hydrated and reminded that she should take Tylenol as needed for fever, body aches or headache.  She was also made aware that her blood pressure was elevated and that she should follow-up with her PCP for recheck of her blood pressure.        FINAL CLINICAL IMPRESSION(S) / ED DIAGNOSES   Final diagnoses:  Viral URI with cough  Elevated blood pressure reading     Rx / DC Orders   ED Discharge Orders          Ordered    brompheniramine-pseudoephedrine-DM 30-2-10 MG/5ML syrup  4 times daily PRN        10/05/21 0944    ondansetron (ZOFRAN-ODT) 4 MG disintegrating tablet  Every 8 hours PRN        10/05/21 0944             Note:  This document was prepared using Dragon voice recognition software and may include unintentional dictation errors.   Tommi Rumps, PA-C 10/05/21 1018    Tommi Rumps,  PA-C 10/05/21 1021    Delton [place]irie, MD 10/05/21 1049

## 2021-10-05 NOTE — Discharge Instructions (Addendum)
Follow-up with your primary care provider or urgent care if any continued problems.  Increase fluids to stay hydrated.  Tylenol or ibuprofen as needed for headache, fever, body aches.  A prescription for Bromfed-DM was sent to your pharmacy as needed for cough and congestion.  Also Zofran was sent to the pharmacy as needed for nausea and vomiting.  Your blood pressure was elevated while in the emergency department.  This also needs to be rechecked at your doctor's office and monitored to see if you also have hypertension.

## 2021-11-02 ENCOUNTER — Other Ambulatory Visit: Payer: Self-pay

## 2021-11-02 ENCOUNTER — Encounter: Payer: Self-pay | Admitting: Emergency Medicine

## 2021-11-02 ENCOUNTER — Emergency Department
Admission: EM | Admit: 2021-11-02 | Discharge: 2021-11-02 | Disposition: A | Discharge status: Home / Self Care | Payer: Medicaid Other | Attending: Emergency Medicine | Admitting: Emergency Medicine

## 2021-11-02 DIAGNOSIS — L258 Unspecified contact dermatitis due to other agents: Secondary | ICD-10-CM | POA: Insufficient documentation

## 2021-11-02 DIAGNOSIS — F419 Anxiety disorder, unspecified: Secondary | ICD-10-CM | POA: Insufficient documentation

## 2021-11-02 DIAGNOSIS — L259 Unspecified contact dermatitis, unspecified cause: Secondary | ICD-10-CM

## 2021-11-02 NOTE — ED Triage Notes (Signed)
Pt to ED via POV with c/o having an allergic reaction to ETOH. Her face around her lips are red, no swelling seen and is able to speak in complete sentences. She noticed the redness around her lips a few days ago, she thought that it was chapped lips, redness is getting worse. She states that her whole body has turned red. She is also nauseated and not able to keep any food down for the last three days.  ?

## 2021-11-02 NOTE — ED Provider Notes (Signed)
? ?  [facility] ?Provider Note ? ? ? Event Date/Time  ? First MD Initiated Contact with Patient 11/02/21 1027   ?  (approximate) ? ? ?History  ? ?Allergic Reaction ? ? ?HPI ? ?Cheyenne Barnes is a 26 y.o. female who presents with concern for allergic reaction.  Patient reports over the last 4 days she has had a red rash around her lips and along her nose.  She thinks it may be related to allergy to alcohol, she does report drinking alcohol daily and has done so for many years.  She also notes that she used a new lotion several days ago and that it was itching after she put it on.  No intraoral swelling, no difficulty breathing, no cough, no hives ?  ? ? ?Physical Exam  ? ?Triage Vital Signs: ?ED Triage Vitals  ?Enc Vitals Group  ?   BP 11/02/21 1012 (!) 154/94  ?   Pulse Rate 11/02/21 1012 (!) 123  ?   Resp 11/02/21 1012 18  ?   Temp 11/02/21 1012 98.6 ?F (37 ?C)  ?   Temp Source 11/02/21 1012 Oral  ?   SpO2 11/02/21 1012 96 %  ?   Weight 11/02/21 1014 108.9 kg (240 lb)  ?   Height 11/02/21 1014 1.6 m (5\' 3" )  ?   Head Circumference --   ?   Peak Flow --   ?   Pain Score 11/02/21 1014 0  ?   Pain Loc --   ?   Pain Edu? --   ?   Excl. in [place]? --   ? ? ?Most recent vital signs: ?Vitals:  ? 11/02/21 1012  ?BP: (!) 154/94  ?Pulse: (!) 123  ?Resp: 18  ?Temp: 98.6 ?F (37 ?C)  ?SpO2: 96%  ? ? ? ?General: Awake, no distress.  ?CV:  Good peripheral perfusion.  ?Resp:  Normal effort.  ?Abd:  No distention.  ?Other:  Skin: Patient with mild erythema around the lips and along the bridge of the nose, most consistent with contact dermatitis ? ? ?ED Results / Procedures / Treatments  ? ?Labs ?(all labs ordered are listed, but only abnormal results are displayed) ?Labs Reviewed - No data to display ? ? ?EKG ? ? ? ? ?RADIOLOGY ? ? ? ? ?PROCEDURES: ? ?Critical Care performed:  ? ?Procedures ? ? ?MEDICATIONS ORDERED IN ED: ?Medications - No data to display ? ? ?IMPRESSION / MDM / ASSESSMENT AND PLAN / ED COURSE   ?I reviewed the triage vital signs and the nursing notes. ? ? ?Patient's exam is most consistent with contact dermatitis, no intraoral lesions or rash, pharynx is normal.  Clear to auscultation bilaterally.  Not consistent with systemic allergic reaction. ? ?Patient is tachycardic upon arrival but she is anxious and also reports that is not unusual for her given her alcohol use. ? ?Recommend supportive treatment for contact dermatitis outpatient follow-up with dermatology as needed ? ? ? ? ?  ? ? ?FINAL CLINICAL IMPRESSION(S) / ED DIAGNOSES  ? ?Final diagnoses:  ?Contact dermatitis, unspecified contact dermatitis type, unspecified trigger  ? ? ? ?Rx / DC Orders  ? ?ED Discharge Orders   ? ? None  ? ?  ? ? ? ?Note:  This document was prepared using Dragon voice recognition software and may include unintentional dictation errors. ?  ?Lavonia Drafts, MD ?11/02/21 1254 ? ?

## 2021-12-14 ENCOUNTER — Ambulatory Visit (LOCAL_COMMUNITY_HEALTH_CENTER): Payer: Medicaid Other | Admitting: Advanced Practice Midwife

## 2021-12-14 ENCOUNTER — Encounter: Payer: Self-pay | Admitting: Advanced Practice Midwife

## 2021-12-14 VITALS — BP 110/73 | HR 77 | Ht 63.0 in | Wt 245.0 lb

## 2021-12-14 DIAGNOSIS — Z3009 Encounter for other general counseling and advice on contraception: Secondary | ICD-10-CM

## 2021-12-14 DIAGNOSIS — Z30011 Encounter for initial prescription of contraceptive pills: Secondary | ICD-10-CM | POA: Diagnosis not present

## 2021-12-14 DIAGNOSIS — F1020 Alcohol dependence, uncomplicated: Secondary | ICD-10-CM | POA: Insufficient documentation

## 2021-12-14 DIAGNOSIS — T1491XA Suicide attempt, initial encounter: Secondary | ICD-10-CM

## 2021-12-14 DIAGNOSIS — F4310 Post-traumatic stress disorder, unspecified: Secondary | ICD-10-CM

## 2021-12-14 DIAGNOSIS — F1210 Cannabis abuse, uncomplicated: Secondary | ICD-10-CM

## 2021-12-14 LAB — HM HIV SCREENING LAB: HM HIV Screening: NEGATIVE

## 2021-12-14 LAB — WET PREP FOR TRICH, YEAST, CLUE
Trichomonas Exam: NEGATIVE
Yeast Exam: NEGATIVE

## 2021-12-14 MED ORDER — NORGESTIM-ETH ESTRAD TRIPHASIC 0.18/0.215/0.25 MG-25 MCG PO TABS: 1.0000 | ORAL_TABLET | Freq: Every day | ORAL | 13 refills | Status: DC
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
## 2021-12-14 NOTE — Progress Notes (Signed)
Wet prep reviewed - negative results. Cambren Helm, RN  

## 2021-12-19 LAB — IGP, RFX APTIMA HPV ASCU: PAP Smear Comment: 0

## 2022-01-06 NOTE — Addendum Note (Signed)
Addended by: Arnetha Courser on: 01/06/2022 12:31 PM   Modules accepted: Level of Service

## 2022-06-11 LAB — GC/CHLAMYDIA PROBE AMP
Chlamydia trachomatis, NAA: NEGATIVE
Neisseria Gonorrhoeae by PCR: NEGATIVE

## 2022-10-27 ENCOUNTER — Other Ambulatory Visit: Payer: Self-pay

## 2022-10-27 ENCOUNTER — Emergency Department: Payer: No Typology Code available for payment source

## 2022-10-27 ENCOUNTER — Emergency Department
Admission: EM | Admit: 2022-10-27 | Discharge: 2022-10-28 | Disposition: A | Discharge status: Home / Self Care | Payer: No Typology Code available for payment source | Attending: Emergency Medicine | Admitting: Emergency Medicine

## 2022-10-27 DIAGNOSIS — R102 Pelvic and perineal pain: Secondary | ICD-10-CM | POA: Insufficient documentation

## 2022-10-27 DIAGNOSIS — N939 Abnormal uterine and vaginal bleeding, unspecified: Secondary | ICD-10-CM | POA: Diagnosis present

## 2022-10-27 LAB — BASIC METABOLIC PANEL
Anion gap: 10 (ref 5–15)
BUN: 14 mg/dL (ref 6–20)
CO2: 24 mmol/L (ref 22–32)
Calcium: 9 mg/dL (ref 8.9–10.3)
Chloride: 103 mmol/L (ref 98–111)
Creatinine, Ser: 0.73 mg/dL (ref 0.44–1.00)
GFR, Estimated: >60 mL/min (ref >60)
Glucose, Bld: 89 mg/dL (ref 70–99)
Potassium: 3.9 mmol/L (ref 3.5–5.1)
Sodium: 137 mmol/L (ref 135–145)

## 2022-10-27 LAB — CHLAMYDIA/NGC RT PCR (ARMC ONLY)
Chlamydia Tr: NOT DETECTED
N gonorrhoeae: NOT DETECTED

## 2022-10-27 LAB — CBC
HCT: 44.1 % (ref 36.0–46.0)
Hemoglobin: 14.3 g/dL (ref 12.0–15.0)
MCH: 31 pg (ref 26.0–34.0)
MCHC: 32.4 g/dL (ref 30.0–36.0)
MCV: 95.5 fL (ref 80.0–100.0)
Platelets: 295 10*3/uL (ref 150–400)
RBC: 4.62 MIL/uL (ref 3.87–5.11)
RDW: 13 % (ref 11.5–15.5)
WBC: 8.7 10*3/uL (ref 4.0–10.5)
nRBC: 0 % (ref 0.0–0.2)

## 2022-10-27 LAB — RAPID HIV SCREEN (HIV 1/2 AB+AG)
HIV 1/2 Antibodies: NONREACTIVE
HIV-1 P24 Antigen - HIV24: NONREACTIVE

## 2022-10-27 LAB — POC URINE PREG, ED: Preg Test, Ur: NEGATIVE

## 2022-10-27 LAB — WET PREP, GENITAL
Clue Cells Wet Prep HPF POC: NONE SEEN
Sperm: NONE SEEN
Trich, Wet Prep: NONE SEEN
WBC, Wet Prep HPF POC: <10 (ref <10)
Yeast Wet Prep HPF POC: NONE SEEN

## 2022-10-27 NOTE — ED Triage Notes (Addendum)
C/O suprapubic pain x 2-3 days.  Yesterday noticed some vaginal swelling.  Also c/o vaginal bleeding.  States period heavy today.    Referred to eD from [facility].  AAOx3.  Skin warm and dry.NAD  Last month started back on oral contraception.  Taking Tri-lo-Mili

## 2022-10-27 NOTE — ED Provider Notes (Signed)
[facility] Provider Note    Event Date/Time   First MD Initiated Contact with Patient 10/27/22 2038     (approximate)   History   Lower abdominal pain, vaginal bleeding   HPI  Cheyenne Barnes is a 27 y.o. female who presents to the emergency department today because of concerns for lower abdominal pain, abnormal vaginal bleeding.  Patient also complains of a vaginal lesion.  Patient states that the abdominal pain and bleeding has been ongoing for roughly the past 2 to 3 days.  She started birth control roughly 1 month ago and last had her period 2 weeks ago.  In addition she has noticed a small lesion.  This area had been somewhat irritating prior to the lesion coming up.     Physical Exam   Triage Vital Signs: ED Triage Vitals  Enc Vitals Group     BP 10/27/22 1730 119/83     Pulse Rate 10/27/22 1730 70     Resp 10/27/22 1730 18     Temp 10/27/22 1730 98 F (36.7 C)     Temp Source 10/27/22 1730 Oral     SpO2 10/27/22 1730 100 %     Weight 10/27/22 1728 244 lb 11.4 oz (111 kg)     Height 10/27/22 1728 '5\' 3"'$  (1.6 m)     Head Circumference --      Peak Flow --      Pain Score 10/27/22 1726 5     Pain Loc --      Pain Edu? --      Excl. in [place]? --     Most recent vital signs: Vitals:   10/27/22 2042 10/27/22 2044  BP: (!) 127/94 (!) 127/94  Pulse: 80 69  Resp: 18 20  Temp:    SpO2: 100% 100%   General: Awake, alert, oriented. CV:  Good peripheral perfusion. Regular rate and rhythm. Resp:  Normal effort. Lungs clear. Abd:  No distention.  Genital: Single small lesion to left labia minora.    ED Results / Procedures / Treatments   Labs (all labs ordered are listed, but only abnormal results are displayed) Labs Reviewed  WET PREP, GENITAL  CHLAMYDIA/NGC RT PCR ([facility] ONLY)            CBC  BASIC METABOLIC PANEL  RAPID HIV SCREEN (HIV 1/2 AB+AG)  RPR  POC URINE PREG, ED     EKG  None   RADIOLOGY I independently  interpreted and visualized the pelvic US. My interpretation: No free fluid. Radiology interpretation:  IMPRESSION:  No sonographic abnormality is seen in the pelvis. There is no  evidence of ovarian torsion.       PROCEDURES:  Critical Care performed: No    MEDICATIONS ORDERED IN ED: Medications - No data to display   IMPRESSION / MDM / [place] / ED COURSE  I reviewed the triage vital signs and the nursing notes.                              Differential diagnosis includes, but is not limited to, ovarian cyst, fibroid, STD  Patient's presentation is most consistent with acute presentation with potential threat to life or bodily function.   Patient presented to the emergency department today because of concerns for abnormal vaginal bleeding as well as a lesion to her vulva.  On exam patient has a single lesion to the left  labia minora.  Does not appear herpetic.  It is not ulcerated.  Did send wet prep as well as media and gonorrhea.  These were negative for concerning findings.  Did obtain an ultrasound given concern for abnormal bleeding and this not show any concerning findings.  Patient did just start birth control recently.  Do wonder if this is what is causing some the abnormal vaginal bleeding.  Certainly would not explain the lesion however at this time unclear etiology.  Do think patient would benefit from OB/GYN follow-up.  Discussed this with the patient.  Will give OB/GYN follow-up information.  Discussed bleeding return precautions.     FINAL CLINICAL IMPRESSION(S) / ED DIAGNOSES   Final diagnoses:  Abnormal vaginal bleeding     Note:  This document was prepared using Dragon voice recognition software and may include unintentional dictation errors.    Nance Pear, MD 10/28/22 [phone]

## 2022-10-27 NOTE — ED Notes (Signed)
Light green top and urine sent to lab

## 2022-10-27 NOTE — ED Triage Notes (Signed)
Pt to ED via POV from [facility]. Pt reports LLQ pain that started 2-3 days. Pt also reports heavy bleeding and blistering.

## 2022-10-27 NOTE — ED Provider Notes (Incomplete)
[facility] Provider Note    Event Date/Time   First MD Initiated Contact with Patient 10/27/22 2038     (approximate)   History   No chief complaint on file.   HPI {Remember to add pertinent medical, surgical, social, and/or OB history to HPI:1} Cheyenne Barnes is a 27 y.o. female  ***       Physical Exam   Triage Vital Signs: ED Triage Vitals  Enc Vitals Group     BP 10/27/22 1730 119/83     Pulse Rate 10/27/22 1730 70     Resp 10/27/22 1730 18     Temp 10/27/22 1730 98 F (36.7 C)     Temp Source 10/27/22 1730 Oral     SpO2 10/27/22 1730 100 %     Weight 10/27/22 1728 244 lb 11.4 oz (111 kg)     Height 10/27/22 1728 '5\' 3"'$  (1.6 m)     Head Circumference --      Peak Flow --      Pain Score 10/27/22 1726 5     Pain Loc --      Pain Edu? --      Excl. in [place]? --     Most recent vital signs: Vitals:   10/27/22 2042 10/27/22 2044  BP: (!) 127/94 (!) 127/94  Pulse: 80 69  Resp: 18 20  Temp:    SpO2: 100% 100%    {Only need to document appropriate and relevant physical exam:1} General: Awake, no distress. *** CV:  Good peripheral perfusion. *** Resp:  Normal effort. *** Abd:  No distention. *** Other:  ***   ED Results / Procedures / Treatments   Labs (all labs ordered are listed, but only abnormal results are displayed) Labs Reviewed  CBC  BASIC METABOLIC PANEL  POC URINE PREG, ED     EKG  ***   RADIOLOGY *** {USE THE WORD "INTERPRETED"!! You MUST document your own interpretation of imaging, as well as the fact that you reviewed the radiologist's report!:1}   PROCEDURES:  Critical Care performed: Yes  CRITICAL CARE Performed by: Nance Pear   Total critical care time: *** minutes  Critical care time was exclusive of separately billable procedures and treating other patients.  Critical care was necessary to treat or prevent imminent or life-threatening deterioration.  Critical care was time  spent personally by me on the following activities: development of treatment plan with patient and/or surrogate as well as nursing, discussions with consultants, evaluation of patient's response to treatment, examination of patient, obtaining history from patient or surrogate, ordering and performing treatments and interventions, ordering and review of laboratory studies, ordering and review of radiographic studies, pulse oximetry and re-evaluation of patient's condition.   Procedures    MEDICATIONS ORDERED IN ED: Medications - No data to display   IMPRESSION / MDM / [place] / ED COURSE  I reviewed the triage vital signs and the nursing notes.                              Differential diagnosis includes, but is not limited to, ***  Patient's presentation is most consistent with {EM COPA:27473}   ***The patient is on the cardiac monitor to evaluate for evidence of arrhythmia and/or significant heart rate changes.  ***      FINAL CLINICAL IMPRESSION(S) / ED DIAGNOSES   Final diagnoses:  None     Rx /  DC Orders   ED Discharge Orders     None        Note:  This document was prepared using Dragon voice recognition software and may include unintentional dictation errors.

## 2022-10-27 NOTE — Discharge Instructions (Signed)
Please seek medical attention for any high fevers, chest pain, shortness of breath, change in behavior, persistent vomiting, bloody stool or any other new or concerning symptoms.  

## 2022-10-28 LAB — RPR: RPR Ser Ql: NONREACTIVE

## 2022-10-28 NOTE — ED Notes (Signed)
Patient discharged at this time. Ambulated to lobby with independent and steady gait. Breathing unlabored speaking in full sentences. Verbalized understanding of all discharge, follow up, and medication teaching. Discharged homed with all belongings.   

## 2022-11-26 ENCOUNTER — Ambulatory Visit: Payer: No Typology Code available for payment source

## 2022-12-31 ENCOUNTER — Ambulatory Visit: Payer: No Typology Code available for payment source | Admitting: Family Medicine

## 2023-01-10 ENCOUNTER — Other Ambulatory Visit: Payer: Self-pay | Admitting: Advanced Practice Midwife

## 2023-01-10 DIAGNOSIS — Z3009 Encounter for other general counseling and advice on contraception: Secondary | ICD-10-CM

## 2023-02-04 ENCOUNTER — Ambulatory Visit: Payer: No Typology Code available for payment source

## 2023-03-04 ENCOUNTER — Ambulatory Visit: Payer: No Typology Code available for payment source | Admitting: Advanced Practice Midwife

## 2023-03-04 VITALS — BP 104/75 | HR 70 | Temp 97.8°F | Wt 213.8 lb

## 2023-03-04 DIAGNOSIS — Z3041 Encounter for surveillance of contraceptive pills: Secondary | ICD-10-CM

## 2023-03-04 DIAGNOSIS — Z3009 Encounter for other general counseling and advice on contraception: Secondary | ICD-10-CM

## 2023-03-04 LAB — HEMOGLOBIN, FINGERSTICK: Hemoglobin: 13.5 g/dL (ref 11.1–15.9)

## 2023-03-04 LAB — WET PREP FOR TRICH, YEAST, CLUE
Trichomonas Exam: NEGATIVE
Yeast Exam: NEGATIVE

## 2023-03-04 LAB — HM HIV SCREENING LAB: HM HIV Screening: NEGATIVE

## 2023-03-04 MED ORDER — NORGESTIM-ETH ESTRAD TRIPHASIC 0.18/0.215/0.25 MG-25 MCG PO TABS: 1.0000 | ORAL_TABLET | Freq: Every day | ORAL | 12 refills | Status: DC
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
## 2023-03-04 NOTE — Progress Notes (Signed)
In house lab results reviewed during visit. BTHIELE RN

## 2023-03-10 NOTE — Addendum Note (Signed)
Addended by: Arnetha Courser on: 03/10/2023 05:55 PM   Modules accepted: Level of Service

## 2023-06-28 ENCOUNTER — Observation Stay
Admission: EM | Admit: 2023-06-28 | Discharge: 2023-06-30 | Disposition: A | Discharge status: Home / Self Care | Payer: No Typology Code available for payment source | Attending: Internal Medicine | Admitting: Internal Medicine

## 2023-06-28 ENCOUNTER — Other Ambulatory Visit: Payer: Self-pay

## 2023-06-28 ENCOUNTER — Encounter: Payer: Self-pay | Admitting: Emergency Medicine

## 2023-06-28 DIAGNOSIS — T391X1A Poisoning by 4-Aminophenol derivatives, accidental (unintentional), initial encounter: Principal | ICD-10-CM | POA: Diagnosis present

## 2023-06-28 DIAGNOSIS — K047 Periapical abscess without sinus: Secondary | ICD-10-CM

## 2023-06-28 DIAGNOSIS — K0889 Other specified disorders of teeth and supporting structures: Secondary | ICD-10-CM | POA: Insufficient documentation

## 2023-06-28 DIAGNOSIS — F1011 Alcohol abuse, in remission: Secondary | ICD-10-CM

## 2023-06-28 DIAGNOSIS — F1091 Alcohol use, unspecified, in remission: Secondary | ICD-10-CM | POA: Insufficient documentation

## 2023-06-28 DIAGNOSIS — T1491XA Suicide attempt, initial encounter: Secondary | ICD-10-CM | POA: Diagnosis present

## 2023-06-28 DIAGNOSIS — R112 Nausea with vomiting, unspecified: Secondary | ICD-10-CM | POA: Diagnosis present

## 2023-06-28 DIAGNOSIS — F1020 Alcohol dependence, uncomplicated: Secondary | ICD-10-CM | POA: Diagnosis present

## 2023-06-28 LAB — CBC WITH DIFFERENTIAL/PLATELET
Abs Immature Granulocytes: 0.27 10*3/uL — ABNORMAL HIGH (ref 0.00–0.07)
Basophils Absolute: 0 10*3/uL (ref 0.0–0.1)
Basophils Relative: 0 %
Eosinophils Absolute: 0.1 10*3/uL (ref 0.0–0.5)
Eosinophils Relative: 1 %
HCT: 40.3 % (ref 36.0–46.0)
Hemoglobin: 13.3 g/dL (ref 12.0–15.0)
Immature Granulocytes: 2 %
Lymphocytes Relative: 12 %
Lymphs Abs: 1.5 10*3/uL (ref 0.7–4.0)
MCH: 31.1 pg (ref 26.0–34.0)
MCHC: 33 g/dL (ref 30.0–36.0)
MCV: 94.2 fL (ref 80.0–100.0)
Monocytes Absolute: 0.6 10*3/uL (ref 0.1–1.0)
Monocytes Relative: 5 %
Neutro Abs: 9.4 10*3/uL — ABNORMAL HIGH (ref 1.7–7.7)
Neutrophils Relative %: 80 %
Platelets: 249 10*3/uL (ref 150–400)
RBC: 4.28 MIL/uL (ref 3.87–5.11)
RDW: 13.1 % (ref 11.5–15.5)
WBC: 11.8 10*3/uL — ABNORMAL HIGH (ref 4.0–10.5)
nRBC: 0 % (ref 0.0–0.2)

## 2023-06-28 LAB — URINE DRUG SCREEN, QUALITATIVE (ARMC ONLY)
Amphetamines, Ur Screen: NOT DETECTED
Barbiturates, Ur Screen: NOT DETECTED
Benzodiazepine, Ur Scrn: NOT DETECTED
Cannabinoid 50 Ng, Ur ~~LOC~~: NOT DETECTED
Cocaine Metabolite,Ur ~~LOC~~: NOT DETECTED
MDMA (Ecstasy)Ur Screen: NOT DETECTED
Methadone Scn, Ur: NOT DETECTED
Opiate, Ur Screen: POSITIVE — AB
Phencyclidine (PCP) Ur S: NOT DETECTED
Tricyclic, Ur Screen: NOT DETECTED

## 2023-06-28 LAB — PROTIME-INR
INR: 1.1 (ref 0.8–1.2)
Prothrombin Time: 14.7 s (ref 11.4–15.2)

## 2023-06-28 LAB — URINALYSIS, COMPLETE (UACMP) WITH MICROSCOPIC
Bacteria, UA: NONE SEEN
Bilirubin Urine: NEGATIVE
Glucose, UA: NEGATIVE mg/dL
Hgb urine dipstick: NEGATIVE
Ketones, ur: 80 mg/dL — AB
Leukocytes,Ua: NEGATIVE
Nitrite: NEGATIVE
Protein, ur: NEGATIVE mg/dL
Specific Gravity, Urine: 1.023 (ref 1.005–1.030)
pH: 5 (ref 5.0–8.0)

## 2023-06-28 LAB — COMPREHENSIVE METABOLIC PANEL
ALT: 18 U/L (ref 0–44)
AST: 16 U/L (ref 15–41)
Albumin: 3.9 g/dL (ref 3.5–5.0)
Alkaline Phosphatase: 77 U/L (ref 38–126)
Anion gap: 8 (ref 5–15)
BUN: 9 mg/dL (ref 6–20)
CO2: 22 mmol/L (ref 22–32)
Calcium: 8.9 mg/dL (ref 8.9–10.3)
Chloride: 107 mmol/L (ref 98–111)
Creatinine, Ser: 0.73 mg/dL (ref 0.44–1.00)
GFR, Estimated: >60 mL/min (ref >60)
Glucose, Bld: 103 mg/dL — ABNORMAL HIGH (ref 70–99)
Potassium: 4 mmol/L (ref 3.5–5.1)
Sodium: 137 mmol/L (ref 135–145)
Total Bilirubin: 0.4 mg/dL (ref <1.2)
Total Protein: 7.1 g/dL (ref 6.5–8.1)

## 2023-06-28 LAB — ACETAMINOPHEN LEVEL: Acetaminophen (Tylenol), Serum: 18 ug/mL (ref 10–30)

## 2023-06-28 LAB — PREGNANCY, URINE: Preg Test, Ur: NEGATIVE

## 2023-06-28 LAB — ETHANOL: Alcohol, Ethyl (B): <10 mg/dL (ref <10)

## 2023-06-28 LAB — SALICYLATE LEVEL: Salicylate Lvl: <7 mg/dL — ABNORMAL LOW (ref 7.0–30.0)

## 2023-06-28 MED ORDER — ACETYLCYSTEINE LOAD VIA INFUSION
150.0000 mg/kg | Freq: Once | INTRAVENOUS | Status: AC
  Administered 2023-06-28: 12720 mg via INTRAVENOUS
  Filled 2023-06-28: qty 417

## 2023-06-28 MED ORDER — DEXTROSE-SODIUM CHLORIDE 5-0.45 % IV SOLN: INTRAVENOUS | Status: AC

## 2023-06-28 MED ORDER — MORPHINE SULFATE (PF) 4 MG/ML IV SOLN
4.0000 mg | INTRAVENOUS | Status: DC | PRN
  Administered 2023-06-28: 4 mg via INTRAVENOUS
  Filled 2023-06-28: qty 1

## 2023-06-28 MED ORDER — ONDANSETRON HCL 4 MG/2ML IJ SOLN
4.0000 mg | Freq: Once | INTRAMUSCULAR | Status: AC
  Administered 2023-06-28: 4 mg via INTRAVENOUS
  Filled 2023-06-28: qty 2

## 2023-06-28 MED ORDER — ONDANSETRON HCL 4 MG PO TABS: 4.0000 mg | ORAL_TABLET | Freq: Four times a day (QID) | ORAL | Status: DC | PRN

## 2023-06-28 MED ORDER — SODIUM CHLORIDE 0.9 % IV SOLN: INTRAVENOUS | Status: DC

## 2023-06-28 MED ORDER — ENOXAPARIN SODIUM 40 MG/0.4ML IJ SOSY
40.0000 mg | PREFILLED_SYRINGE | INTRAMUSCULAR | Status: DC
  Administered 2023-06-28 – 2023-06-30 (×3): 40 mg via SUBCUTANEOUS
  Filled 2023-06-28 (×3): qty 0.4

## 2023-06-28 MED ORDER — DEXTROSE 5 % IV SOLN
15.0000 mg/kg/h | INTRAVENOUS | Status: DC
  Administered 2023-06-28 (×2): 15 mg/kg/h via INTRAVENOUS
  Filled 2023-06-28 (×4): qty 90

## 2023-06-28 MED ORDER — ONDANSETRON HCL 4 MG/2ML IJ SOLN
4.0000 mg | Freq: Four times a day (QID) | INTRAMUSCULAR | Status: DC | PRN
  Administered 2023-06-28 – 2023-06-29 (×2): 4 mg via INTRAVENOUS
  Filled 2023-06-28 (×2): qty 2

## 2023-06-28 MED ORDER — MORPHINE SULFATE (PF) 4 MG/ML IV SOLN
4.0000 mg | INTRAVENOUS | Status: DC | PRN
  Administered 2023-06-28 – 2023-06-29 (×3): 4 mg via INTRAVENOUS
  Filled 2023-06-28 (×3): qty 1

## 2023-06-28 MED ORDER — ACETYLCYSTEINE LOAD VIA INFUSION: 150.0000 mg/kg | Freq: Once | INTRAVENOUS | Status: DC

## 2023-06-28 MED ORDER — SODIUM CHLORIDE 0.9 % IV SOLN
3.0000 g | Freq: Four times a day (QID) | INTRAVENOUS | Status: DC
  Administered 2023-06-28 – 2023-06-30 (×9): 3 g via INTRAVENOUS
  Filled 2023-06-28 (×12): qty 8

## 2023-06-28 NOTE — ED Provider Notes (Signed)
[facility] Provider Note    Event Date/Time   First MD Initiated Contact with Patient 06/28/23 [phone]     (approximate)   History   Ingestion   HPI  Cheyenne Barnes is a 27 y.o. female who presents to the ER for evaluation of abdominal pain nausea, vomiting.  Patient ingested 30 to 40 500 mg Tylenol tablets over the past 24 hours.  She did this because she has been having severe dental pain and has not been able to follow-up with dentist yet.  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  She been sober for 1 year.  Denies any intent for self-harm and was not aware that Tylenol was potentially toxic.     Physical Exam   Triage Vital Signs: ED Triage Vitals [06/28/23 0706]  Encounter Vitals Group     BP 130/62     Systolic BP Percentile      Diastolic BP Percentile      Pulse Rate 60     Resp 18     Temp 97.9 F (36.6 C)     Temp Source Oral     SpO2 100 %     Weight 187 lb (84.8 kg)     Height 5\' 3"  (1.6 m)     Head Circumference      Peak Flow      Pain Score 9     Pain Loc      Pain Education      Exclude from Growth Chart     Most recent vital signs: Vitals:   06/28/23 0706 06/28/23 0730  BP: 130/62 (!) 118/95  Pulse: 60 84  Resp: 18 20  Temp: 97.9 F (36.6 C)   SpO2: 100% 100%     Constitutional: Alert  Eyes: Conjunctivae are normal.  Head: Atraumatic. Nose: No congestion/rhinnorhea. Mouth/Throat: Mucous membranes are moist.   Neck: Painless ROM.  Cardiovascular:   Good peripheral circulation. Respiratory: Normal respiratory effort.  No retractions.  Gastrointestinal: Soft and nontender.  Musculoskeletal:  no deformity Neurologic:  MAE spontaneously. No gross focal neurologic deficits are appreciated.  Skin:  Skin is warm, dry and intact. No rash noted. Psychiatric: Mood and affect are normal. Speech and behavior are normal.    ED Results / Procedures / Treatments   Labs (all labs ordered are listed, but only  abnormal results are displayed) Labs Reviewed  SALICYLATE LEVEL - Abnormal; Notable for the following components:      Result Value   Salicylate Lvl <7.0 (*)    All other components within normal limits  COMPREHENSIVE METABOLIC PANEL - Abnormal; Notable for the following components:   Glucose, Bld 103 (*)    All other components within normal limits  CBC WITH DIFFERENTIAL/PLATELET - Abnormal; Notable for the following components:   WBC 11.8 (*)    Neutro Abs 9.4 (*)    Abs Immature Granulocytes 0.27 (*)    All other components within normal limits  ACETAMINOPHEN LEVEL  ETHANOL  PROTIME-INR     EKG  ED ECG REPORT I, Willy Eddy, the attending physician, personally viewed and interpreted this ECG.   Date: 06/28/2023  EKG Time: 7:16  Rate: 60  Rhythm: sinus  Axis: normal  Intervals:normal  ST&T Change: no stemi    RADIOLOGY    PROCEDURES:  Critical Care performed: yes  .Critical Care  Performed by: Willy Eddy, MD Authorized by: Willy Eddy, MD   Critical care provider statement:    Critical  care time (minutes):  40   Critical care was necessary to treat or prevent imminent or life-threatening deterioration of the following conditions:  Toxidrome   Critical care was time spent personally by me on the following activities:  Ordering and performing treatments and interventions, ordering and review of laboratory studies, ordering and review of radiographic studies, pulse oximetry, re-evaluation of patient's condition, review of old charts, obtaining history from patient or surrogate, examination of patient, evaluation of patient's response to treatment, discussions with primary provider, discussions with consultants and development of treatment plan with patient or surrogate    MEDICATIONS ORDERED IN ED: Medications  morphine (PF) 4 MG/ML injection 4 mg (4 mg Intravenous Given 06/28/23 0757)  acetylcysteine (ACETADOTE) 30.5 mg/mL load via infusion  12,720 mg (has no administration in time range)    Followed by  acetylcysteine (ACETADOTE) 18,000 mg in dextrose 5 % 590 mL (30.5085 mg/mL) infusion (has no administration in time range)  ondansetron (ZOFRAN) injection 4 mg (4 mg Intravenous Given 06/28/23 0757)     IMPRESSION / MDM / ASSESSMENT AND PLAN / ED COURSE  I reviewed the triage vital signs and the nursing notes.                              Differential diagnosis includes, but is not limited to, Tylenol overdose, enteritis, gastritis, carious dentition, electrolyte abnormality  Patient presenting to the ER for evaluation of symptoms as described above.  Based on symptoms, risk factors and considered above differential, this presenting complaint could reflect a potentially life-threatening illness therefore the patient will be placed on continuous pulse oximetry and telemetry for monitoring.  Laboratory evaluation will be sent to evaluate for the above complaints.      Clinical Course as of 06/28/23 0981  Tue Jun 28, 2023  0825 Patient discussed in consultation with poison control.  Patient's 24-hour Tylenol level is 18 which is above the nomogram.  Given her symptoms they are recommending Acetadote which I have ordered.  Will consult hospitalist for admission.  The remainder of blood work is fortunately reassuring. [PR]    Clinical Course User Index [PR] Willy Eddy, MD     FINAL CLINICAL IMPRESSION(S) / ED DIAGNOSES   Final diagnoses:  Accidental acetaminophen overdose, initial encounter     Rx / DC Orders   ED Discharge Orders     None        Note:  This document was prepared using Dragon voice recognition software and may include unintentional dictation errors.    Willy Eddy, MD 06/28/23 [phone]

## 2023-06-28 NOTE — TOC Progression Note (Signed)
Transition of Care [facility]) - Progression Note    Patient Details  Name: Cheyenne Barnes MRN: [id number] Date of Birth: [birth year]  [facility] CM/SW Contact  Marlowe Sax, RN Phone Number: 06/28/2023, 1:27 PM  Clinical Narrative:    Transition of Care [facility]) - Inpatient Brief Assessment   Patient Details  Name: Cheyenne Barnes MRN: [id number] Date of Birth: Jul 26, 1996  Transition of Care [facility]) CM/SW Contact:    Marlowe Sax, RN Phone Number: 06/28/2023, 1:27 PM   Clinical Narrative: No TOC needs Identified   Transition of Care Asessment: Insurance and Status: Insurance coverage has been reviewed Patient has primary care physician: No (Nurse Navigator set up PCP for the patient) Home environment has been reviewed: lives with Sister, drives to appointments Prior level of function:: independent Prior/Current Home Services: No current home services Social Determinants of Health Reivew: SDOH reviewed no interventions necessary Readmission risk has been reviewed: Yes Transition of care needs: no transition of care needs at this time         Expected Discharge Plan and Services                                               Social Determinants of Health (SDOH) Interventions SDOH Screenings   Food Insecurity: No Food Insecurity (06/28/2023)  Housing: Low Risk  (06/28/2023)  Transportation Needs: No Transportation Needs (06/28/2023)  Utilities: Not At Risk (06/28/2023)  Depression (PHQ2-9): Low Risk  (12/14/2021)  Tobacco Use: Medium Risk (06/28/2023)    Readmission Risk Interventions     No data to display

## 2023-06-28 NOTE — H&P (Signed)
History and Physical    Patient: Cheyenne Barnes:[id number] DOB: May 01, 1996 DOA: 06/28/2023 DOS: the patient was seen and examined on 06/28/2023 PCP: Pcp, No  Patient coming from: Home  Chief Complaint:  Chief Complaint  Patient presents with   Ingestion   HPI: Cheyenne Barnes is a 27 y.o. female with medical history significant of no significant medical history apart from history of ITP presenting with Tylenol overdose and tooth fracture/infection.  Patient reports being evaluated for tooth fracture yesterday.  Was placed on course of Augmentin as well as oral Tylenol for pain.  Patient reports not wanting pain medication as her boyfriend had overdose secondary to opiates.  Patient reports taking roughly 6-7 rounds of 6 pills of 500 mg Tylenol over 24-hour period.  Reports worsening nausea and abdominal pain since around 1 AM.  Remote history of SI in the past.  Denies any active SI.  Patient was unaware of dosing limit of Tylenol.  No chest pain or shortness of breath.  Positive nausea.  Generalized abdominal pain.  No shortness of breath.  Denies any alcohol or tobacco use.  Patient reports prior alcohol use with alcohol abstinence x 1 year. Presented to the ER afebrile, hemodynamically stable.  Satting well on room air.  White count 11.8, hemoglobin 13.3, platelets 249.  INR 1.1.  Tylenol and ethanol level within normal limits.  Poison control contacted.  Recommendation for loading as well as maintenance dose of acid and overnight observation. Review of Systems: As mentioned in the history of present illness. All other systems reviewed and are negative. 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 She has been exposed to tobacco smoke. She has never used smokeless tobacco. She reports that she does not currently use alcohol after a past usage of about 10.0 standard drinks of alcohol per week. She reports current drug use. Drugs: Marijuana and "Crack" cocaine.  Allergies  Allergen Reactions   Codeine Nausea And Vomiting   Sulfa Antibiotics Swelling and Rash   Bee Venom Swelling    Family History  Problem Relation Age of Onset   COPD Maternal Grandmother    Hypertension Maternal Grandmother    Migraines Maternal Grandmother    Bipolar disorder Maternal Grandmother    Ovarian cancer Maternal Grandmother    Heart disease Mother    Hypertension Mother    Depression Mother    Migraines Mother    Congestive Heart Failure Mother    Asthma Mother    Bipolar disorder Brother    ADD / ADHD Brother    Asthma Brother    COPD Brother    Scoliosis Half-Sister     Prior to Admission medications   Medication Sig Start Date End Date Taking? Authorizing Provider  albuterol (VENTOLIN HFA) 108 (90 Base) MCG/ACT inhaler Inhale  2 puffs into the lungs every 6 (six) hours as needed. 06/13/19   Menshew, Charlesetta Ivory, PA-C  brompheniramine-pseudoephedrine-DM 30-2-10 MG/5ML syrup Take 5 mLs by mouth 4 (four) times daily as needed. Patient not taking: Reported on 12/14/2021 10/05/21   Tommi Rumps, PA-C  cetirizine (ZYRTEC) 10 MG tablet Take 10 mg by mouth daily. Patient not taking: Reported on 03/04/2023    [provider]  Norgestimate-Ethinyl Estradiol Triphasic (TRI-LO-SPRINTEC) 0.18/0.215/0.25  MG-25 MCG tab Take 1 tablet by mouth daily. 03/04/23   Sciora, Austin Miles, CNM  ondansetron (ZOFRAN-ODT) 4 MG disintegrating tablet Take 1 tablet (4 mg total) by mouth every 8 (eight) hours as needed for nausea or vomiting. Patient not taking: Reported on 12/14/2021 10/05/21   Tommi Rumps, PA-C  TRI-LO-MILI 0.18/0.215/0.25 MG-25 MCG tab Take 1 tablet by mouth once daily Patient not taking: Reported on 03/04/2023 01/13/23   Alberteen Spindle, CNM    Physical Exam: Vitals:   06/28/23 0706 06/28/23 0730 06/28/23 0800  BP: 130/62 (!) 118/95 121/79  Pulse: 60 84 (!) 55  Resp: 18 20 12   Temp: 97.9 F (36.6 C)    TempSrc: Oral    SpO2: 100% 100% 100%  Weight: 84.8 kg    Height: 5\' 3"  (1.6 m)     Physical Exam Constitutional:      Appearance: She is normal weight.  HENT:     Head: Normocephalic and atraumatic.     Nose: Nose normal.     Mouth/Throat:     Mouth: Mucous membranes are moist.  Eyes:     Pupils: Pupils are equal, round, and reactive to light.  Cardiovascular:     Rate and Rhythm: Normal rate and regular rhythm.  Pulmonary:     Effort: Pulmonary effort is normal.  Abdominal:     General: Bowel sounds are normal.     Tenderness: There is abdominal tenderness.  Musculoskeletal:        General: Normal range of motion.  Skin:    General: Skin is warm.  Neurological:     General: No focal deficit present.  Psychiatric:        Mood and Affect: Mood normal.     Data Reviewed:  There are no new results to review at this time.   Lab Results  Component Value Date   WBC 11.8 (H) 06/28/2023   HGB 13.3 06/28/2023   HCT 40.3 06/28/2023   MCV 94.2 06/28/2023   PLT 249 06/28/2023   Last metabolic panel Lab Results  Component Value Date   GLUCOSE 103 (H) 06/28/2023   NA 137 06/28/2023   K 4.0 06/28/2023   CL 107 06/28/2023   CO2 22 06/28/2023   BUN 9 06/28/2023   CREATININE 0.73 06/28/2023   GFRNONAA >60 06/28/2023   CALCIUM 8.9 06/28/2023   PROT 7.1  06/28/2023   ALBUMIN 3.9 06/28/2023   BILITOT 0.4 06/28/2023   ALKPHOS 77 06/28/2023   AST 16 06/28/2023   ALT 18 06/28/2023   ANIONGAP 8 06/28/2023    Assessment and Plan: * Tylenol overdose Patient with ingestion of roughly 20,000 g of Tylenol associated with right posterior tooth fracture and infection Unintentional-patient was unaware of Tylenol daily dose limits Poison control contacted Started on Acetadote bolus and continuous drip Will plan for repeat Tylenol level and CMP tomorrow morning and 7 AM per poison control recommendations Pain control with IV morphine IV Zofran Monitor    Pain, dental Positive dental pain status post right  posterior tooth fracture Will transition from Augmentin to Unasyn in the setting of intractable nausea vomiting associated with Tylenol overdose IV morphine for pain Monitor   Suicide attempt [facility]) age 50 No reported HI/SI in setting of Tylenol overdose  Alcoholic (HCC) Patient reports being alcohol free x 1 year Monitor    Greater than 50% was spent in counseling and coordination of care with patient Total encounter time 80 minutes or more   Advance Care Planning:   Code Status: Full Code   Consults: None   Family Communication: No family at the bedside   Severity of Illness: The appropriate patient status for this patient is OBSERVATION. Observation status is judged to be reasonable and necessary in order to provide the required intensity of service to ensure the patient's safety. The patient's presenting symptoms, physical exam findings, and initial radiographic and laboratory data in the context of their medical condition is felt to place them at decreased risk for further clinical deterioration. Furthermore, it is anticipated that the patient will be medically stable for discharge from the hospital within 2 midnights of admission.   Author: Floydene Flock, MD 06/28/2023 9:37 AM  For on call review [email or web address].

## 2023-06-28 NOTE — Assessment & Plan Note (Signed)
Patient reports being alcohol free x 1 year Monitor

## 2023-06-28 NOTE — Progress Notes (Signed)
Introduced patient to role of Statistician. Intake questions completed. Patient does NOT currently have a PCP, but IS agreeable to establishment of care with a female provider.  Patient lives with her sister, and drives to/from appointments.  Patient states this Sunday will make "1 year sober from alcohol after drinking for about 10 years, with the last 5 years being heavy use." Patient does have a history of THC use, as well.  Denies any SDOH needs at present.

## 2023-06-28 NOTE — Assessment & Plan Note (Signed)
Positive dental pain status post right posterior tooth fracture Will transition from Augmentin to Unasyn in the setting of intractable nausea vomiting associated with Tylenol overdose IV morphine for pain Monitor

## 2023-06-28 NOTE — ED Triage Notes (Signed)
Patient to ED via POV for ingestion. Patient having dental pain- took a whole bottle of tylenol (approx 40 tabs, 500mg  each) over a 24 hour period due to pain. States she is having abd pain and vomiting since 1am. Started antibiotic for dental pain yesterday. Denies SI. Also states she took 3-4 ibuprofen (x2) yesterday.

## 2023-06-28 NOTE — Discharge Instructions (Signed)
Your nurse navigator, Abbeygail Igoe can be reached at [phone]

## 2023-06-28 NOTE — Assessment & Plan Note (Signed)
Patient with ingestion of roughly 20,000 g of Tylenol associated with right posterior tooth fracture and infection Unintentional-patient was unaware of Tylenol daily dose limits Poison control contacted Started on Acetadote bolus and continuous drip Will plan for repeat Tylenol level and CMP tomorrow morning and 7 AM per poison control recommendations Pain control with IV morphine IV Zofran Monitor

## 2023-06-28 NOTE — Progress Notes (Signed)
N-Acetylcysteine for APAP Overdose  Baseline Labs: CMET,  PT/INR=1.1,  APAP lvl=18,  salicylate lvl <7.0,  urine toxicology screen, -messaged MD to order urine pregnancy test for female pts- messaged MD to order AST/ALT = 16/18    IV: N-acetylcysteine 150 mg/kg over 1 hour (started 11/12 0907), then 15 mg/kg/hr   Labs (22hr into Maintenance Dose N-acetylcysteine and q24h x 3 unless otherwise stated):  CMET, APAP lvl (unless undetectable), PT/INR if abnormal on admit.   11/13 0700 CMP, APAP levels ordered  Poison Control Center: [phone]  Criteria for D/C N-acetylcysteine: Minimum of 6 doses of oral NAC or 24 hours of IV NAC have been administered Patient is asymptomatic AST and ALT normal Serum bicarbonate >= 20 or pH >= 7.25 INR < 2 (if required) Acetaminophen level <= 10 mcg/mL (if required)    Bari Mantis PharmD Clinical Pharmacist 06/28/2023

## 2023-06-28 NOTE — Assessment & Plan Note (Signed)
No reported HI/SI in setting of Tylenol overdose

## 2023-06-28 NOTE — Progress Notes (Signed)
Pharmacy Antibiotic Note  Cheyenne Barnes is a 27 y.o. female admitted on 06/28/2023 with  dental infection .  Pharmacy has been consulted for Unasyn dosing.  -transition from Augmentin to Unasyn in the setting of intractable nausea vomiting associated with Tylenol overdose per H&P  Plan: Unasyn 3 gm IV q6h   Height: 5\' 3"  (160 cm) Weight: 84.8 kg (187 lb) IBW/kg (Calculated) : 52.4  Temp (24hrs), Avg:97.9 F (36.6 C), Min:97.8 F (36.6 C), Max:97.9 F (36.6 C)  Recent Labs  Lab 06/28/23 0742  WBC 11.8*  CREATININE 0.73    Estimated Creatinine Clearance: 107.1 mL/min (by C-G formula based on SCr of 0.73 mg/dL).    Allergies  Allergen Reactions   Codeine Nausea And Vomiting   Sulfa Antibiotics Swelling and Rash   Bee Venom Swelling    Antimicrobials this admission: unasyn 11/12 >>       >>    Dose adjustments this admission:    Microbiology results:   BCx:     UCx:      Sputum:      MRSA PCR:    Thank you for allowing pharmacy to be a part of this patient's care.  Bari Mantis PharmD Clinical Pharmacist 06/28/2023

## 2023-06-29 DIAGNOSIS — T391X1D Poisoning by 4-Aminophenol derivatives, accidental (unintentional), subsequent encounter: Secondary | ICD-10-CM

## 2023-06-29 DIAGNOSIS — R112 Nausea with vomiting, unspecified: Secondary | ICD-10-CM | POA: Diagnosis not present

## 2023-06-29 LAB — COMPREHENSIVE METABOLIC PANEL
ALT: 20 U/L (ref 0–44)
AST: 13 U/L — ABNORMAL LOW (ref 15–41)
Albumin: 3 g/dL — ABNORMAL LOW (ref 3.5–5.0)
Alkaline Phosphatase: 61 U/L (ref 38–126)
Anion gap: 10 (ref 5–15)
BUN: <5 mg/dL — ABNORMAL LOW (ref 6–20)
CO2: 23 mmol/L (ref 22–32)
Calcium: 8.2 mg/dL — ABNORMAL LOW (ref 8.9–10.3)
Chloride: 104 mmol/L (ref 98–111)
Creatinine, Ser: 0.45 mg/dL (ref 0.44–1.00)
GFR, Estimated: >60 mL/min (ref >60)
Glucose, Bld: 133 mg/dL — ABNORMAL HIGH (ref 70–99)
Potassium: 3.2 mmol/L — ABNORMAL LOW (ref 3.5–5.1)
Sodium: 137 mmol/L (ref 135–145)
Total Bilirubin: 0.2 mg/dL (ref <1.2)
Total Protein: 6 g/dL — ABNORMAL LOW (ref 6.5–8.1)

## 2023-06-29 LAB — ACETAMINOPHEN LEVEL: Acetaminophen (Tylenol), Serum: <10 ug/mL — ABNORMAL LOW (ref 10–30)

## 2023-06-29 MED ORDER — DEXTROSE-SODIUM CHLORIDE 5-0.45 % IV SOLN
INTRAVENOUS | Status: AC
  Administered 2023-06-29: 125 mL/h via INTRAVENOUS

## 2023-06-29 MED ORDER — ALUM & MAG HYDROXIDE-SIMETH 200-200-20 MG/5ML PO SUSP: 30.0000 mL | ORAL | Status: DC | PRN

## 2023-06-29 MED ORDER — KETOROLAC TROMETHAMINE 15 MG/ML IJ SOLN
15.0000 mg | Freq: Four times a day (QID) | INTRAMUSCULAR | Status: DC | PRN
  Administered 2023-06-29 (×2): 15 mg via INTRAVENOUS
  Filled 2023-06-29 (×2): qty 1

## 2023-06-29 MED ORDER — FAMOTIDINE 20 MG PO TABS
20.0000 mg | ORAL_TABLET | Freq: Two times a day (BID) | ORAL | Status: DC
  Administered 2023-06-29 – 2023-06-30 (×3): 20 mg via ORAL
  Filled 2023-06-29 (×3): qty 1

## 2023-06-29 MED ORDER — TRAMADOL HCL 50 MG PO TABS
50.0000 mg | ORAL_TABLET | Freq: Four times a day (QID) | ORAL | Status: DC | PRN
  Administered 2023-06-29 – 2023-06-30 (×2): 50 mg via ORAL
  Filled 2023-06-29 (×2): qty 1

## 2023-06-29 MED ORDER — POTASSIUM CHLORIDE CRYS ER 20 MEQ PO TBCR
40.0000 meq | EXTENDED_RELEASE_TABLET | Freq: Once | ORAL | Status: AC
  Administered 2023-06-29: 40 meq via ORAL
  Filled 2023-06-29: qty 2

## 2023-06-29 MED ORDER — PANTOPRAZOLE SODIUM 40 MG PO TBEC
40.0000 mg | DELAYED_RELEASE_TABLET | Freq: Every day | ORAL | Status: DC
  Administered 2023-06-29 – 2023-06-30 (×2): 40 mg via ORAL
  Filled 2023-06-29 (×2): qty 1

## 2023-06-29 NOTE — Progress Notes (Signed)
PROGRESS NOTE    Cheyenne Barnes  GNF:[id number] DOB: May 13, 1996 DOA: 06/28/2023 PCP: Pcp, No    Brief Narrative:  27 y.o. female with medical history significant of no significant medical history apart from history of ITP presenting with Tylenol overdose and tooth fracture/infection.  Patient reports being evaluated for tooth fracture yesterday.  Was placed on course of Augmentin as well as oral Tylenol for pain.  Patient reports not wanting pain medication as her boyfriend had overdose secondary to opiates.  Patient reports taking roughly 6-7 rounds of 6 pills of 500 mg Tylenol over 24-hour period.  Reports worsening nausea and abdominal pain since around 1 AM.  Remote history of SI in the past.  Denies any active SI.  Patient was unaware of dosing limit of Tylenol.  No chest pain or shortness of breath.  Positive nausea.  Generalized abdominal pain.  No shortness of breath.  Denies any alcohol or tobacco use.  Patient reports prior alcohol use with alcohol abstinence x 1 year. Presented to the ER afebrile, hemodynamically stable.  Satting well on room air.  White count 11.8, hemoglobin 13.3, platelets 249.  INR 1.1.  Tylenol and ethanol level within normal limits.  Poison control contacted  11/13: Poison control contacted.  Recommend discontinuation of N-acetylcysteine infusion.  Tylenol level undetectable.  LFTs normal.  Kidney function normal.   Assessment & Plan:   Principal Problem:   Tylenol overdose Active Problems:   Alcoholic (HCC)   Suicide attempt [facility]) age 27   Pain, dental  * Tylenol overdose Unintentional.  Patient took approximately 42 pills of 500 mg Tylenol Was unaware of Tylenol daily dose limits Poison control contacted Started on N-acetylcysteine gtt. Tylenol level negative as of 11/13 Poison control advises discontinuation of drip Plan: Pain control Antiemetics Continue IV fluids Anticipate discharge 11/14  Pain, dental Positive dental pain status post  right posterior tooth fracture Plan: Continue Unasyn Can de-escalate back to Augmentin in preparation for discharge tomorrow  Suicide attempt [facility]) age 27 No reported HI/SI in setting of Tylenol overdose   Alcoholic (HCC) Patient reports being alcohol free x 1 year Monitor   DVT prophylaxis: Lovenox Code Status: Full Family Communication: None Disposition Plan: Status is: Inpatient Remains inpatient appropriate because: An unintentional Tylenol overdose associated with nausea vomiting and abdominal pain   Level of care: Med-Surg  Consultants:  None  Procedures:  None  Antimicrobials: Unasyn   Subjective: Seen and examined.  Resting in bed.  No visible distress.  Reports some dental pain but improved from prior.  Objective: Vitals:   06/28/23 1614 06/28/23 1954 06/29/23 0439 06/29/23 0813  BP: 129/86 127/83 128/86 120/77  Pulse: 70 91 76 86  Resp: 18  18 16   Temp: 98.2 F (36.8 C) 98.8 F (37.1 C) 98.4 F (36.9 C) 98.6 F (37 C)  TempSrc: Oral   Oral  SpO2: 98% 100% 99% 97%  Weight:      Height:        Intake/Output Summary (Last 24 hours) at 06/29/2023 1208 Last data filed at 06/28/2023 1559 Gross per 24 hour  Intake 341.7 ml  Output --  Net 341.7 ml   Filed Weights   06/28/23 0706  Weight: 84.8 kg    Examination:  General exam: No acute distress.  Mildly anxious Respiratory system: Clear to auscultation. Respiratory effort normal. Cardiovascular system: S1-S2, RRR, no murmurs, no pedal edema Gastrointestinal system: Soft, NT/ND, normal bowel sounds Central nervous system: Alert and oriented. No focal neurological  deficits. Extremities: Symmetric 5 x 5 power. Skin: No rashes, lesions or ulcers Psychiatry: Judgement and insight appear normal. Mood & affect appropriate.     Data Reviewed: I have personally reviewed following labs and imaging studies  CBC: Recent Labs  Lab 06/28/23 0742  WBC 11.8*  NEUTROABS 9.4*  HGB 13.3  HCT 40.3   MCV 94.2  PLT 249   Basic Metabolic Panel: Recent Labs  Lab 06/28/23 0742 06/29/23 0656  NA 137 137  K 4.0 3.2*  CL 107 104  CO2 22 23  GLUCOSE 103* 133*  BUN 9 <5*  CREATININE 0.73 0.45  CALCIUM 8.9 8.2*   GFR: Estimated Creatinine Clearance: 107.1 mL/min (by C-G formula based on SCr of 0.45 mg/dL). Liver Function Tests: Recent Labs  Lab 06/28/23 0742 06/29/23 0656  AST 16 13*  ALT 18 20  ALKPHOS 77 61  BILITOT 0.4 0.2  PROT 7.1 6.0*  ALBUMIN 3.9 3.0*   No results for input(s): "LIPASE", "AMYLASE" in the last 168 hours. No results for input(s): "AMMONIA" in the last 168 hours. Coagulation Profile: Recent Labs  Lab 06/28/23 0742  INR 1.1   Cardiac Enzymes: No results for input(s): "CKTOTAL", "CKMB", "CKMBINDEX", "TROPONINI" in the last 168 hours. BNP (last 3 results) No results for input(s): "PROBNP" in the last 8760 hours. HbA1C: No results for input(s): "HGBA1C" in the last 72 hours. CBG: No results for input(s): "GLUCAP" in the last 168 hours. Lipid Profile: No results for input(s): "CHOL", "HDL", "LDLCALC", "TRIG", "CHOLHDL", "LDLDIRECT" in the last 72 hours. Thyroid Function Tests: No results for input(s): "TSH", "T4TOTAL", "FREET4", "T3FREE", "THYROIDAB" in the last 72 hours. Anemia Panel: No results for input(s): "VITAMINB12", "FOLATE", "FERRITIN", "TIBC", "IRON", "RETICCTPCT" in the last 72 hours. Sepsis Labs: No results for input(s): "PROCALCITON", "LATICACIDVEN" in the last 168 hours.  No results found for this or any previous visit (from the past 240 hour(s)).       Radiology Studies: No results found.      Scheduled Meds:  enoxaparin (LOVENOX) injection  40 mg Subcutaneous Q24H   Continuous Infusions:  ampicillin-sulbactam (UNASYN) IV 3 g (06/29/23 0529)     LOS: 1 day     Tresa Moore, MD [facility]   If 7PM-7AM, please contact night-coverage  06/29/2023, 12:08 PM

## 2023-06-29 NOTE — Plan of Care (Signed)

## 2023-06-29 NOTE — Plan of Care (Signed)

## 2023-06-30 DIAGNOSIS — R112 Nausea with vomiting, unspecified: Secondary | ICD-10-CM | POA: Diagnosis not present

## 2023-06-30 DIAGNOSIS — T391X1D Poisoning by 4-Aminophenol derivatives, accidental (unintentional), subsequent encounter: Secondary | ICD-10-CM | POA: Diagnosis not present

## 2023-06-30 LAB — CBC WITH DIFFERENTIAL/PLATELET
Abs Immature Granulocytes: 0.03 10*3/uL (ref 0.00–0.07)
Basophils Absolute: 0 10*3/uL (ref 0.0–0.1)
Basophils Relative: 1 %
Eosinophils Absolute: 0.1 10*3/uL (ref 0.0–0.5)
Eosinophils Relative: 2 %
HCT: 35.6 % — ABNORMAL LOW (ref 36.0–46.0)
Hemoglobin: 12 g/dL (ref 12.0–15.0)
Immature Granulocytes: 1 %
Lymphocytes Relative: 26 %
Lymphs Abs: 1.6 10*3/uL (ref 0.7–4.0)
MCH: 31.5 pg (ref 26.0–34.0)
MCHC: 33.7 g/dL (ref 30.0–36.0)
MCV: 93.4 fL (ref 80.0–100.0)
Monocytes Absolute: 0.6 10*3/uL (ref 0.1–1.0)
Monocytes Relative: 10 %
Neutro Abs: 3.8 10*3/uL (ref 1.7–7.7)
Neutrophils Relative %: 60 %
Platelets: 193 10*3/uL (ref 150–400)
RBC: 3.81 MIL/uL — ABNORMAL LOW (ref 3.87–5.11)
RDW: 13.3 % (ref 11.5–15.5)
WBC: 6.2 10*3/uL (ref 4.0–10.5)
nRBC: 0 % (ref 0.0–0.2)

## 2023-06-30 LAB — BASIC METABOLIC PANEL
Anion gap: 7 (ref 5–15)
BUN: <5 mg/dL — ABNORMAL LOW (ref 6–20)
CO2: 24 mmol/L (ref 22–32)
Calcium: 8.4 mg/dL — ABNORMAL LOW (ref 8.9–10.3)
Chloride: 106 mmol/L (ref 98–111)
Creatinine, Ser: 0.55 mg/dL (ref 0.44–1.00)
GFR, Estimated: >60 mL/min (ref >60)
Glucose, Bld: 93 mg/dL (ref 70–99)
Potassium: 3.5 mmol/L (ref 3.5–5.1)
Sodium: 137 mmol/L (ref 135–145)

## 2023-06-30 MED ORDER — ALUM & MAG HYDROXIDE-SIMETH 200-200-20 MG/5ML PO SUSP: 30.0000 mL | ORAL | Status: DC | PRN

## 2023-06-30 MED ORDER — FAMOTIDINE 20 MG PO TABS: 20.0000 mg | ORAL_TABLET | Freq: Two times a day (BID) | ORAL | 0 refills | Status: AC

## 2023-06-30 MED ORDER — PANTOPRAZOLE SODIUM 40 MG PO TBEC: 40.0000 mg | DELAYED_RELEASE_TABLET | Freq: Every day | ORAL | 0 refills | Status: DC

## 2023-06-30 MED ORDER — TRAMADOL HCL 50 MG PO TABS: 50.0000 mg | ORAL_TABLET | Freq: Four times a day (QID) | ORAL | 0 refills | Status: DC | PRN

## 2023-06-30 MED ORDER — ONDANSETRON HCL 4 MG PO TABS: 4.0000 mg | ORAL_TABLET | Freq: Four times a day (QID) | ORAL | 0 refills | Status: DC | PRN

## 2023-06-30 NOTE — Plan of Care (Signed)

## 2023-06-30 NOTE — Discharge Summary (Signed)
Physician Discharge Summary  Cheyenne Barnes:[id number] DOB: [birth year] DOA: 06/28/2023  PCP: Oneita Hurt, No  Admit date: 06/28/2023 Discharge date: 06/30/2023  Admitted From: Home Disposition:  Home  Recommendations for Outpatient Follow-up:  Follow up with PCP in 1-2 weeks   Home Health:No Equipment/Devices:None   Discharge Condition:Stable  CODE STATUS:FULL  Diet recommendation: Full liquid/soft  Brief/Interim Summary:  27 y.o. female with medical history significant of no significant medical history apart from history of ITP presenting with Tylenol overdose and tooth fracture/infection.  Patient reports being evaluated for tooth fracture yesterday.  Was placed on course of Augmentin as well as oral Tylenol for pain.  Patient reports not wanting pain medication as her boyfriend had overdose secondary to opiates.  Patient reports taking roughly 6-7 rounds of 6 pills of 500 mg Tylenol over 24-hour period.  Reports worsening nausea and abdominal pain since around 1 AM.  Remote history of SI in the past.  Denies any active SI.  Patient was unaware of dosing limit of Tylenol.  No chest pain or shortness of breath.  Positive nausea.  Generalized abdominal pain.  No shortness of breath.  Denies any alcohol or tobacco use.  Patient reports prior alcohol use with alcohol abstinence x 1 year. Presented to the ER afebrile, hemodynamically stable.  Satting well on room air.  White count 11.8, hemoglobin 13.3, platelets 249.  INR 1.1.  Tylenol and ethanol level within normal limits.  Poison control contacted   11/13: Poison control contacted.  Recommend discontinuation of N-acetylcysteine infusion.  Tylenol level undetectable.  LFTs normal.  Kidney function normal.  11/14: Lytes remain stable.  Still with abd pain, but no nausea.  Stable for dc home.  Rec soft diet, adequate liquids, avoid APAP, NSAIDs    Discharge Diagnoses:  Principal Problem:   Tylenol overdose Active Problems:    Alcoholic (HCC)   Suicide attempt [facility]) age 14   Pain, dental   * Tylenol overdose Unintentional.  Patient took approximately 42 pills of 500 mg Tylenol Was unaware of Tylenol daily dose limits Poison control contacted Started on N-acetylcysteine gtt. Tylenol level negative as of 11/13 Poison control advises discontinuation of drip Plan: Discharge home.  Pain control with tramadol.  Avoid APAP, NSAIDs   Pain, dental Positive dental pain status post right posterior tooth fracture Plan: Resume augmentin   Discharge Instructions  Discharge Instructions     Diet general   Complete by: As directed    Start with liquid diet and as your symptoms improve, slowly advance to soft, bland solid foods   Increase activity slowly   Complete by: As directed       Allergies as of 06/30/2023       Reactions   Codeine Nausea And Vomiting   Sulfa Antibiotics Swelling, Rash   Bee Venom Swelling        Medication List     STOP taking these medications    acetaminophen 500 MG tablet Commonly known as: TYLENOL   clindamycin 150 MG capsule Commonly known as: CLEOCIN   Tri-Lo-Mili 0.18/0.215/0.25 MG-25 MCG Tabs Generic drug: Norgestimate-Eth Estradiol       TAKE these medications    alum & mag hydroxide-simeth 200-200-20 MG/5ML suspension Commonly known as: MAALOX/MYLANTA Take 30 mLs by mouth every 4 (four) hours as needed for indigestion.   amoxicillin-clavulanate 500-125 MG tablet Commonly known as: AUGMENTIN Take 1 tablet by mouth 2 (two) times daily.   APPLE CIDER VINEGAR PO Take 1 tablet by mouth daily.  BIOTIN PO Take 1 tablet by mouth daily.   famotidine 20 MG tablet Commonly known as: PEPCID Take 1 tablet (20 mg total) by mouth 2 (two) times daily for 14 days.   Multivitamin Women Tabs Take 1 tablet by mouth daily. Woman's Multivitamin + Omega 3   ondansetron 4 MG tablet Commonly known as: ZOFRAN Take 1 tablet (4 mg total) by mouth every 6 (six)  hours as needed for nausea.   pantoprazole 40 MG tablet Commonly known as: PROTONIX Take 1 tablet (40 mg total) by mouth daily for 14 days. Start taking on: July 01, 2023   PROBIOTIC-PREBIOTIC PO Take 1 capsule by mouth daily.   traMADol 50 MG tablet Commonly known as: ULTRAM Take 1 tablet (50 mg total) by mouth every 6 (six) hours as needed for severe pain (pain score 7-10) or moderate pain (pain score 4-6).        Follow-up Information     Mort Sawyers, FNP. Go on 07/04/2023.   Specialty: Family Medicine Why: Please arrive at 67 for your 1120 appointment for establishment of care Contact information: [address] [place] [address] [phone]                Allergies  Allergen Reactions   Codeine Nausea And Vomiting   Sulfa Antibiotics Swelling and Rash   Bee Venom Swelling    Consultations: None   Procedures/Studies: No results found.    Subjective: Seen and examined on the day of discharge.  Still some abdominal pain but otherwise stable.  Appropriate for discharge home at this time  Discharge Exam: Vitals:   06/30/23 0334 06/30/23 0829  BP: (!) 109/57 105/75  Pulse: 62 87  Resp: 18 18  Temp: 98.2 F (36.8 C) 98 F (36.7 C)  SpO2: 100% 100%   Vitals:   06/29/23 1619 06/29/23 1934 06/30/23 0334 06/30/23 0829  BP: 120/85 114/78 (!) 109/57 105/75  Pulse: 78 82 62 87  Resp: 18 18 18 18   Temp: 98.3 F (36.8 C) 98 F (36.7 C) 98.2 F (36.8 C) 98 F (36.7 C)  TempSrc: Oral     SpO2: 96% 100% 100% 100%  Weight:      Height:        General: Pt is alert, awake, not in acute distress Cardiovascular: RRR, S1/S2 +, no rubs, no gallops Respiratory: CTA bilaterally, no wheezing, no rhonchi Abdominal: Soft, NT, ND, bowel sounds + Extremities: no edema, no cyanosis    The results of significant diagnostics from this hospitalization (including imaging, microbiology, ancillary and laboratory) are listed below for  reference.     Microbiology: No results found for this or any previous visit (from the past 240 hour(s)).   Labs: BNP (last 3 results) No results for input(s): "BNP" in the last 8760 hours. Basic Metabolic Panel: Recent Labs  Lab 06/28/23 0742 06/29/23 0656 06/30/23 0809  NA 137 137 137  K 4.0 3.2* 3.5  CL 107 104 106  CO2 22 23 24   GLUCOSE 103* 133* 93  BUN 9 <5* <5*  CREATININE 0.73 0.45 0.55  CALCIUM 8.9 8.2* 8.4*   Liver Function Tests: Recent Labs  Lab 06/28/23 0742 06/29/23 0656  AST 16 13*  ALT 18 20  ALKPHOS 77 61  BILITOT 0.4 0.2  PROT 7.1 6.0*  ALBUMIN 3.9 3.0*   No results for input(s): "LIPASE", "AMYLASE" in the last 168 hours. No results for input(s): "AMMONIA" in the last 168 hours. CBC: Recent Labs  Lab 06/28/23 0742 06/30/23 0809  WBC 11.8* 6.2  NEUTROABS 9.4* 3.8  HGB 13.3 12.0  HCT 40.3 35.6*  MCV 94.2 93.4  PLT 249 193   Cardiac Enzymes: No results for input(s): "CKTOTAL", "CKMB", "CKMBINDEX", "TROPONINI" in the last 168 hours. BNP: Invalid input(s): "POCBNP" CBG: No results for input(s): "GLUCAP" in the last 168 hours. D-Dimer No results for input(s): "DDIMER" in the last 72 hours. Hgb A1c No results for input(s): "HGBA1C" in the last 72 hours. Lipid Profile No results for input(s): "CHOL", "HDL", "LDLCALC", "TRIG", "CHOLHDL", "LDLDIRECT" in the last 72 hours. Thyroid function studies No results for input(s): "TSH", "T4TOTAL", "T3FREE", "THYROIDAB" in the last 72 hours.  Invalid input(s): "FREET3" Anemia work up No results for input(s): "VITAMINB12", "FOLATE", "FERRITIN", "TIBC", "IRON", "RETICCTPCT" in the last 72 hours. Urinalysis    Component Value Date/Time   COLORURINE YELLOW (A) 06/28/2023 1630   APPEARANCEUR CLEAR (A) 06/28/2023 1630   APPEARANCEUR Hazy 08/22/2014 0147   LABSPEC 1.023 06/28/2023 1630   LABSPEC 1.027 08/22/2014 0147   PHURINE 5.0 06/28/2023 1630   GLUCOSEU NEGATIVE 06/28/2023 1630   GLUCOSEU  Negative 08/22/2014 0147   HGBUR NEGATIVE 06/28/2023 1630   BILIRUBINUR NEGATIVE 06/28/2023 1630   BILIRUBINUR Negative 08/22/2014 0147   KETONESUR 80 (A) 06/28/2023 1630   PROTEINUR NEGATIVE 06/28/2023 1630   NITRITE NEGATIVE 06/28/2023 1630   LEUKOCYTESUR NEGATIVE 06/28/2023 1630   LEUKOCYTESUR Negative 08/22/2014 0147   Sepsis Labs Recent Labs  Lab 06/28/23 0742 06/30/23 0809  WBC 11.8* 6.2   Microbiology No results found for this or any previous visit (from the past 240 hour(s)).   Time coordinating discharge: Over 30 minutes  SIGNED:   Tresa Moore, MD  [facility] 06/30/2023, 11:53 AM Pager   If 7PM-7AM, please contact night-coverage

## 2023-07-04 ENCOUNTER — Encounter: Payer: Self-pay | Admitting: Emergency Medicine

## 2023-07-04 ENCOUNTER — Ambulatory Visit (INDEPENDENT_AMBULATORY_CARE_PROVIDER_SITE_OTHER): Payer: No Typology Code available for payment source | Admitting: Family

## 2023-07-04 ENCOUNTER — Encounter: Payer: Self-pay | Admitting: Family

## 2023-07-04 VITALS — BP 100/68 | HR 73 | Temp 98.2°F | Ht 63.0 in | Wt 187.2 lb

## 2023-07-04 DIAGNOSIS — E049 Nontoxic goiter, unspecified: Secondary | ICD-10-CM | POA: Insufficient documentation

## 2023-07-04 DIAGNOSIS — Z8249 Family history of ischemic heart disease and other diseases of the circulatory system: Secondary | ICD-10-CM | POA: Diagnosis not present

## 2023-07-04 DIAGNOSIS — F1011 Alcohol abuse, in remission: Secondary | ICD-10-CM | POA: Insufficient documentation

## 2023-07-04 DIAGNOSIS — Z13220 Encounter for screening for lipoid disorders: Secondary | ICD-10-CM | POA: Diagnosis not present

## 2023-07-04 DIAGNOSIS — F4310 Post-traumatic stress disorder, unspecified: Secondary | ICD-10-CM | POA: Diagnosis not present

## 2023-07-04 DIAGNOSIS — F428 Other obsessive-compulsive disorder: Secondary | ICD-10-CM | POA: Insufficient documentation

## 2023-07-04 DIAGNOSIS — D693 Immune thrombocytopenic purpura: Secondary | ICD-10-CM | POA: Diagnosis not present

## 2023-07-04 DIAGNOSIS — T391X1A Poisoning by 4-Aminophenol derivatives, accidental (unintentional), initial encounter: Secondary | ICD-10-CM | POA: Insufficient documentation

## 2023-07-04 DIAGNOSIS — F321 Major depressive disorder, single episode, moderate: Secondary | ICD-10-CM | POA: Insufficient documentation

## 2023-07-04 DIAGNOSIS — Z9151 Personal history of suicidal behavior: Secondary | ICD-10-CM | POA: Insufficient documentation

## 2023-07-04 DIAGNOSIS — F411 Generalized anxiety disorder: Secondary | ICD-10-CM | POA: Insufficient documentation

## 2023-07-04 LAB — LIPID PANEL
Cholesterol: 204 mg/dL — ABNORMAL HIGH (ref 0–200)
HDL: 44.7 mg/dL (ref >39.00)
LDL Cholesterol: 146 mg/dL — ABNORMAL HIGH (ref 0–99)
NonHDL: 158.96
Total CHOL/HDL Ratio: 5
Triglycerides: 66 mg/dL (ref 0.0–149.0)
VLDL: 13.2 mg/dL (ref 0.0–40.0)

## 2023-07-04 LAB — TSH: TSH: 0.57 u[IU]/mL (ref 0.35–5.50)

## 2023-07-04 LAB — T4, FREE: Free T4: 1 ng/dL (ref 0.60–1.60)

## 2023-07-04 LAB — T3, FREE: T3, Free: 4.1 pg/mL (ref 2.3–4.2)

## 2023-07-04 NOTE — Assessment & Plan Note (Signed)
Stable per pt  Completed rituxan therapy and steroid therapy  Was with hemoc but then discharged prn and was advised only f/u if bruising, changes  Most recent CBC plts normal

## 2023-07-04 NOTE — Assessment & Plan Note (Signed)
Preference at this time to refer to psychiatry and psychology prior to starting medications to establish dx prior to deciding on treatment regimen.   Ddx OCD , mood disorder, MDD, GAD, PTSD

## 2023-07-04 NOTE — Assessment & Plan Note (Signed)
Assessed phq9

## 2023-07-04 NOTE — Patient Instructions (Addendum)
------------------------------------    Therapy- [facility]  [place]  tel: [phone]   ------------------------------------   A referral was placed today for psychiatry, psychology and cardiology.  Please let us know if you have not heard back within 2 weeks about the referral.   ------------------------------------

## 2023-07-04 NOTE — Assessment & Plan Note (Signed)
Treated in hospital Reviewed hospital records  Labs unremarkable Pt has thrown out tylenol bottle, states knows not to take as many as she did  Asymptomatic

## 2023-07-04 NOTE — Assessment & Plan Note (Signed)
Referral placed for family history workup for heart disease.  Ordering lipid panel, lpa pending results.

## 2023-07-04 NOTE — Progress Notes (Signed)
New Patient Office Visit  Subjective:  Patient ID: Cheyenne Barnes, female    DOB: 01/27/1996  Age: 27 y.o. MRN: [id number]  CC:  Chief Complaint  Patient presents with   Establish Care    HPI Cheyenne Barnes is here to establish care as a new patient.  Oriented to practice routines and expectations.  Prior provider was: has not had one in awhile.    Pt is with acute concerns.   Anxiety, fear of abandonment, increased anxiety  Constant need for everything to be 'perfect' and 'nothing can go wrong', stuck with routine to a point of if the routine is off she is  Sister and mom with bipolar. Does have racing thoughts ,hard to turn off at times. She does also go to school, work and go to gym daily so a lot on her plate. Sleeps on average 4-5 hours a night. Does have h/o suicidal thoughts and suicidal attempts. She states since she has stopped SI since she stopped drinking which was one year ago.    Went to ER [facility] 06/28/23, admitted. Discharged 06/30/23 And took 'way to much tylenol' for a tooth infection/abscess where it sent body into a 'shock' for suspected tylenol toxicity she states they hung a 'reversal treatment' on her IV and she wasn't allowed to eat for days. Was discharged from hospital with augmentin bid x 10 days for tooth infection.  She was also discharged with protonix as well as pepcid as well for the way her stomach felt after taking all of the tylenol. Was unable to eat until last night when she had chicken soup and she was able to tolerate it.  EKG NSR  Consulted with poison control, tylenol level 18 above the normal level. Was given acetadote Cbc cmp unremarkable  Opiate positive, was given morphine in hospital Schedule to have wisdom teeth took out in one week with dentist.       ROS: Negative unless specifically indicated above in HPI.   Current Outpatient Medications:    amoxicillin-clavulanate (AUGMENTIN) 500-125 MG tablet, Take 1 tablet by mouth 3  (three) times daily., Disp: , Rfl:    famotidine (PEPCID) 10 MG tablet, Take 10 mg by mouth 2 (two) times daily., Disp: , Rfl:    ondansetron (ZOFRAN) 4 MG tablet, Take 4 mg by mouth every 8 (eight) hours as needed for nausea or vomiting., Disp: , Rfl:    pantoprazole (PROTONIX) 40 MG tablet, Take 40 mg by mouth daily., Disp: , Rfl:  Past Medical History:  Diagnosis Date   Chronic ITP (idiopathic thrombocytopenia) (HCC)    diagnosed 2014   Past Surgical History:  Procedure Laterality Date   ABSCESS DRAINAGE     TONSILLECTOMY      Objective:   Today's Vitals: BP 100/68 (BP Location: Right Arm, Patient Position: Sitting, Cuff Size: Normal)   Pulse 73   Temp 98.2 F (36.8 C) (Temporal)   Ht 5\' 3"  (1.6 m)   Wt 187 lb 3.2 oz (84.9 kg)   SpO2 95%   BMI 33.16 kg/m   Physical Exam Constitutional:      General: She is not in acute distress.    Appearance: Normal appearance. She is normal weight. She is not ill-appearing.  HENT:     Head: Normocephalic.     Right Ear: Tympanic membrane normal.     Left Ear: Tympanic membrane normal.     Nose: Nose normal.     Mouth/Throat:     Mouth: Mucous membranes  are moist.  Eyes:     Extraocular Movements: Extraocular movements intact.     Pupils: Pupils are equal, round, and reactive to light.  Neck:     Thyroid: Thyromegaly present. No thyroid mass or thyroid tenderness.  Cardiovascular:     Rate and Rhythm: Normal rate and regular rhythm.  Pulmonary:     Effort: Pulmonary effort is normal.     Breath sounds: Normal breath sounds.  Abdominal:     General: Abdomen is flat. Bowel sounds are normal.     Palpations: Abdomen is soft.     Tenderness: There is no guarding or rebound.  Musculoskeletal:        General: Normal range of motion.     Cervical back: Normal range of motion.  Skin:    General: Skin is warm.     Capillary Refill: Capillary refill takes less than 2 seconds.  Neurological:     General: No focal deficit present.      Mental Status: She is alert.  Psychiatric:        Mood and Affect: Mood normal.        Behavior: Behavior normal.        Thought Content: Thought content normal.        Judgment: Judgment normal.     Assessment & Plan:  Chronic ITP (idiopathic thrombocytopenia) (HCC) Assessment & Plan: Stable per pt  Completed rituxan therapy and steroid therapy  Was with hemoc but then discharged prn and was advised only f/u if bruising, changes  Most recent CBC plts normal   Family history of heart attack Assessment & Plan: Referral placed for family history workup for heart disease.  Ordering lipid panel, lpa pending results.  Orders: -     Ambulatory referral to Cardiology -     Lipoprotein A (LPA)  PTSD (post-traumatic stress disorder) Assessment & Plan: Recommendation for CBC/ EMDR brain spotting  Referral placed for psychology  Orders: -     Ambulatory referral to Psychology -     Ambulatory referral to Psychiatry  Anxiety state Assessment & Plan: Preference at this time to refer to psychiatry and psychology prior to starting medications to establish dx prior to deciding on treatment regimen.   Ddx OCD , mood disorder, MDD, GAD, PTSD   Orders: -     Ambulatory referral to Psychology -     Ambulatory referral to Psychiatry  Depression, major, single episode, moderate (HCC) -     Ambulatory referral to Psychology -     Ambulatory referral to Psychiatry  Obsessional thoughts -     Ambulatory referral to Psychology -     Ambulatory referral to Psychiatry  Screening for lipoid disorders -     Lipoprotein A (LPA) -     Lipid panel  Goiter -     TSH -     Thyroid Peroxidase Antibodies (TPO) (REFL) -     T3, free -     T4, free  History of suicide attempt Assessment & Plan: Assessed phq9     History of alcohol abuse  Accidental acetaminophen overdose, initial encounter Assessment & Plan: Treated in hospital Reviewed hospital records  Labs  unremarkable Pt has thrown out tylenol bottle, states knows not to take as many as she did  Asymptomatic      Follow-up: Return in about 3 months (around 10/04/2023) for f/u depression.   Mort Sawyers, FNP

## 2023-07-04 NOTE — Assessment & Plan Note (Signed)
Recommendation for CBC/ EMDR brain spotting  Referral placed for psychology

## 2023-07-05 ENCOUNTER — Ambulatory Visit: Payer: No Typology Code available for payment source

## 2023-07-05 ENCOUNTER — Telehealth: Payer: Self-pay | Admitting: Family

## 2023-07-05 ENCOUNTER — Other Ambulatory Visit: Payer: Self-pay | Admitting: Family

## 2023-07-05 ENCOUNTER — Encounter: Payer: Self-pay | Admitting: *Deleted

## 2023-07-05 DIAGNOSIS — E049 Nontoxic goiter, unspecified: Secondary | ICD-10-CM

## 2023-07-05 NOTE — Telephone Encounter (Signed)
Received a call from Dr. Shelda Altes office regarding referral placed for this patient. Caller advised that they are not in network with patient's insurance. Please advise

## 2023-07-05 NOTE — Progress Notes (Signed)
Can we add on TSI

## 2023-07-07 NOTE — Telephone Encounter (Signed)
Referral faxed to [place] [facility]. Pt sent a Medical laboratory scientific officer with office information

## 2023-07-08 LAB — LIPOPROTEIN A (LPA): Lipoprotein (a): 326 nmol/L — ABNORMAL HIGH (ref <75)

## 2023-07-08 LAB — THYROID PEROXIDASE ANTIBODIES (TPO) (REFL): Thyroperoxidase Ab SerPl-aCnc: <1 [IU]/mL (ref <9)

## 2023-07-08 LAB — THYROID STIMULATING IMMUNOGLOBULIN: TSI: <89 %{baseline} (ref <140)

## 2023-07-12 ENCOUNTER — Encounter: Payer: Self-pay | Admitting: Family

## 2023-07-12 DIAGNOSIS — E7801 Familial hypercholesterolemia: Secondary | ICD-10-CM | POA: Insufficient documentation

## 2023-09-13 ENCOUNTER — Telehealth: Payer: Self-pay

## 2023-09-13 NOTE — Telephone Encounter (Signed)
The patient is scheduled to see [name], MD on 09/18/2022 as a new patient. Called and left a voice message to inquire about any previous cardiac history. Requested the patient to call back at their earliest convenience.

## 2023-09-18 NOTE — Progress Notes (Deleted)
 NO SHOW

## 2023-09-19 ENCOUNTER — Ambulatory Visit
Payer: No Typology Code available for payment source | Attending: Cardiovascular Disease | Admitting: Cardiovascular Disease

## 2023-09-19 DIAGNOSIS — E7841 Elevated Lipoprotein(a): Secondary | ICD-10-CM

## 2023-09-19 DIAGNOSIS — Z8249 Family history of ischemic heart disease and other diseases of the circulatory system: Secondary | ICD-10-CM

## 2023-09-19 DIAGNOSIS — E7801 Familial hypercholesterolemia: Secondary | ICD-10-CM

## 2023-10-07 ENCOUNTER — Ambulatory Visit: Payer: No Typology Code available for payment source | Admitting: Family

## 2023-10-13 ENCOUNTER — Telehealth: Payer: Self-pay | Admitting: Family

## 2023-10-13 ENCOUNTER — Encounter: Payer: Self-pay | Admitting: *Deleted

## 2023-10-13 NOTE — Telephone Encounter (Signed)
Patient has an appointment scheduled tomorrow.

## 2023-10-13 NOTE — Telephone Encounter (Signed)
 Pts appt is tomorrow.  Did we receive the fax?

## 2023-10-13 NOTE — Telephone Encounter (Signed)
 Placed in providers S-drive.   Copied from CRM [id number]. Topic: General - Other >> Oct 13, 2023  9:19 AM Adaysia C wrote: Reason for CRM: Patient called to inform the provider that she is faxing a release form for her wisdom teeth removal to the clinic.

## 2023-10-13 NOTE — Telephone Encounter (Signed)
 Pt will need an OV before forms can be filled out. Please call and schedule. Thank you!

## 2023-10-14 ENCOUNTER — Ambulatory Visit: Payer: No Typology Code available for payment source | Admitting: Family

## 2023-10-14 ENCOUNTER — Encounter: Payer: Self-pay | Admitting: Family

## 2023-10-14 VITALS — BP 114/72 | HR 71 | Temp 98.1°F | Ht 63.0 in | Wt 174.0 lb

## 2023-10-14 DIAGNOSIS — J069 Acute upper respiratory infection, unspecified: Secondary | ICD-10-CM | POA: Diagnosis not present

## 2023-10-14 DIAGNOSIS — R509 Fever, unspecified: Secondary | ICD-10-CM | POA: Diagnosis not present

## 2023-10-14 LAB — POC COVID19 BINAXNOW: SARS Coronavirus 2 Ag: NEGATIVE

## 2023-10-14 LAB — POCT RAPID STREP A (OFFICE): Rapid Strep A Screen: NEGATIVE

## 2023-10-14 LAB — POCT INFLUENZA A/B
Influenza A, POC: NEGATIVE
Influenza B, POC: NEGATIVE

## 2023-10-14 NOTE — Assessment & Plan Note (Signed)
 Covid flu and strep neg in office. Advised patient on supportive measures:  Be sure to rest, drink plenty of fluids, and use tylenol or ibuprofen as needed for pain. Follow up if fever >101, if symptoms worsen or if symptoms are not improved in 3 days. Patient verbalizes understanding.

## 2023-10-14 NOTE — Progress Notes (Signed)
 Established Patient Office Visit  Subjective:   Patient ID: Cheyenne Barnes, female    DOB: [birth year]  Age: 28 y.o. MRN: [id number]  CC:  Chief Complaint  Patient presents with   Acute Visit    Reports fever, sore throat, sinus congestion    HPI: Cheyenne Barnes is a 29 y.o. female presenting on 10/14/2023 for Acute Visit (Reports fever, sore throat, sinus congestion)  Yesterday woke up with a fever up to 101 F  Has not tested for anything at home.  Having a sore throat, nasal congestion, runny nose, ear is popping slightly. No fever this am. Slight dry non productive cough.    For the past one week has had body aches as well.         ROS: Negative unless specifically indicated above in HPI.   Relevant past medical history reviewed and updated as indicated.   Allergies and medications reviewed and updated.   Current Outpatient Medications:    famotidine (PEPCID) 20 MG tablet, Take 1 tablet (20 mg total) by mouth 2 (two) times daily for 14 days., Disp: 28 tablet, Rfl: 0   alum & mag hydroxide-simeth (MAALOX/MYLANTA) 200-200-20 MG/5ML suspension, Take 30 mLs by mouth every 4 (four) hours as needed for indigestion., Disp: , Rfl:    amoxicillin-clavulanate (AUGMENTIN) 500-125 MG tablet, Take 1 tablet by mouth 3 (three) times daily., Disp: , Rfl:    APPLE CIDER VINEGAR PO, Take 1 tablet by mouth daily., Disp: , Rfl:    Bacillus Coagulans-Inulin (PROBIOTIC-PREBIOTIC PO), Take 1 capsule by mouth daily., Disp: , Rfl:    BIOTIN PO, Take 1 tablet by mouth daily., Disp: , Rfl:    famotidine (PEPCID) 10 MG tablet, Take 10 mg by mouth 2 (two) times daily., Disp: , Rfl:    Multiple Vitamins-Minerals (MULTIVITAMIN WOMEN) TABS, Take 1 tablet by mouth daily. Woman's Multivitamin + Omega 3, Disp: , Rfl:    ondansetron (ZOFRAN) 4 MG tablet, Take 1 tablet (4 mg total) by mouth every 6 (six) hours as needed for nausea., Disp: 20 tablet, Rfl: 0   pantoprazole (PROTONIX) 40 MG tablet,  Take 40 mg by mouth daily., Disp: , Rfl:    traMADol (ULTRAM) 50 MG tablet, Take 1 tablet (50 mg total) by mouth every 6 (six) hours as needed for severe pain (pain score 7-10) or moderate pain (pain score 4-6)., Disp: 30 tablet, Rfl: 0  Allergies  Allergen Reactions   Codeine Nausea And Vomiting   Codeine Anaphylaxis    Syncope  Abn breathing   Sulfa Antibiotics Swelling and Rash   Bee Venom Swelling   Sulfa Antibiotics Rash    swelling    Objective:   BP 114/72 (BP Location: Left Arm, Patient Position: Sitting, Cuff Size: Normal)   Pulse 71   Temp 98.1 F (36.7 C) (Oral)   Ht 5\' 3"  (1.6 m)   Wt 174 lb (78.9 kg)   LMP 09/13/2023 (Exact Date)   SpO2 99%   BMI 30.82 kg/m    Physical Exam Constitutional:      General: She is not in acute distress.    Appearance: Normal appearance. She is normal weight. She is not ill-appearing, toxic-appearing or diaphoretic.  HENT:     Head: Normocephalic.     Right Ear: Tympanic membrane normal.     Left Ear: Tympanic membrane normal.     Nose: Nose normal.     Mouth/Throat:     Mouth: Mucous membranes are dry.  Pharynx: No oropharyngeal exudate or posterior oropharyngeal erythema.  Eyes:     Extraocular Movements: Extraocular movements intact.     Pupils: Pupils are equal, round, and reactive to light.  Cardiovascular:     Rate and Rhythm: Normal rate and regular rhythm.     Pulses: Normal pulses.     Heart sounds: Normal heart sounds.  Pulmonary:     Effort: Pulmonary effort is normal.     Breath sounds: Normal breath sounds.  Musculoskeletal:     Cervical back: Normal range of motion.  Lymphadenopathy:     Cervical: Cervical adenopathy present.     Right cervical: Superficial cervical adenopathy present.     Left cervical: Superficial cervical adenopathy present.  Neurological:     General: No focal deficit present.     Mental Status: She is alert and oriented to person, place, and time. Mental status is at baseline.   Psychiatric:        Mood and Affect: Mood normal.        Behavior: Behavior normal.        Thought Content: Thought content normal.        Judgment: Judgment normal.     Assessment & Plan:  Fever, unspecified fever cause -     POCT rapid strep A -     POCT Influenza A/B -     POC COVID-19 BinaxNow  Viral upper respiratory infection Assessment & Plan: Covid flu and strep neg in office. Advised patient on supportive measures:  Be sure to rest, drink plenty of fluids, and use tylenol or ibuprofen as needed for pain. Follow up if fever >101, if symptoms worsen or if symptoms are not improved in 3 days. Patient verbalizes understanding.        Follow up plan: Return if symptoms worsen or fail to improve.  Mort Sawyers, FNP

## 2023-10-21 ENCOUNTER — Ambulatory Visit: Payer: No Typology Code available for payment source | Admitting: Family

## 2023-10-25 NOTE — Telephone Encounter (Signed)
 erroneous

## 2023-10-28 ENCOUNTER — Ambulatory Visit: Admitting: Family

## 2023-10-28 ENCOUNTER — Encounter: Payer: Self-pay | Admitting: Family

## 2023-10-28 VITALS — BP 104/66 | HR 61 | Temp 98.1°F | Ht 63.0 in | Wt 172.0 lb

## 2023-10-28 DIAGNOSIS — D693 Immune thrombocytopenic purpura: Secondary | ICD-10-CM

## 2023-10-28 DIAGNOSIS — F1011 Alcohol abuse, in remission: Secondary | ICD-10-CM

## 2023-10-28 DIAGNOSIS — E669 Obesity, unspecified: Secondary | ICD-10-CM

## 2023-10-28 DIAGNOSIS — K0889 Other specified disorders of teeth and supporting structures: Secondary | ICD-10-CM | POA: Diagnosis not present

## 2023-10-28 DIAGNOSIS — E7801 Familial hypercholesterolemia: Secondary | ICD-10-CM

## 2023-10-28 DIAGNOSIS — T391X1D Poisoning by 4-Aminophenol derivatives, accidental (unintentional), subsequent encounter: Secondary | ICD-10-CM

## 2023-10-28 DIAGNOSIS — Z01818 Encounter for other preprocedural examination: Secondary | ICD-10-CM | POA: Diagnosis not present

## 2023-10-28 LAB — COMPREHENSIVE METABOLIC PANEL
ALT: 11 U/L (ref 0–35)
AST: 12 U/L (ref 0–37)
Albumin: 4.6 g/dL (ref 3.5–5.2)
Alkaline Phosphatase: 80 U/L (ref 39–117)
BUN: 14 mg/dL (ref 6–23)
CO2: 29 meq/L (ref 19–32)
Calcium: 9.5 mg/dL (ref 8.4–10.5)
Chloride: 103 meq/L (ref 96–112)
Creatinine, Ser: 0.64 mg/dL (ref 0.40–1.20)
GFR: 119.31 mL/min (ref >60.00)
Glucose, Bld: 82 mg/dL (ref 70–99)
Potassium: 4.5 meq/L (ref 3.5–5.1)
Sodium: 140 meq/L (ref 135–145)
Total Bilirubin: 0.7 mg/dL (ref 0.2–1.2)
Total Protein: 7.1 g/dL (ref 6.0–8.3)

## 2023-10-28 LAB — CBC
HCT: 40.9 % (ref 36.0–46.0)
Hemoglobin: 13.5 g/dL (ref 12.0–15.0)
MCHC: 33 g/dL (ref 30.0–36.0)
MCV: 95.5 fl (ref 78.0–100.0)
Platelets: 252 10*3/uL (ref 150.0–400.0)
RBC: 4.28 Mil/uL (ref 3.87–5.11)
RDW: 13.7 % (ref 11.5–15.5)
WBC: 6.3 10*3/uL (ref 4.0–10.5)

## 2023-10-28 LAB — LIPID PANEL
Cholesterol: 184 mg/dL (ref 0–200)
HDL: 60.9 mg/dL (ref >39.00)
LDL Cholesterol: 113 mg/dL — ABNORMAL HIGH (ref 0–99)
NonHDL: 122.81
Total CHOL/HDL Ratio: 3
Triglycerides: 49 mg/dL (ref 0.0–149.0)
VLDL: 9.8 mg/dL (ref 0.0–40.0)

## 2023-10-28 LAB — PROTIME-INR
INR: 1.2 ratio — ABNORMAL HIGH (ref 0.8–1.0)
Prothrombin Time: 12.7 s (ref 9.6–13.1)

## 2023-10-28 LAB — POCT URINE PREGNANCY: Preg Test, Ur: NEGATIVE

## 2023-10-28 NOTE — Assessment & Plan Note (Addendum)
 EKG cxr not needed pt does not meet criteria  Reviewed EKG 1/25  Pending lab work however overall low risk and likely cleared pending results

## 2023-10-28 NOTE — Assessment & Plan Note (Signed)
 Stable Will continue to monitor, cbc ordered today pendin gresults

## 2023-10-28 NOTE — Addendum Note (Signed)
 Addended by: Jaynee Eagles C on: 10/28/2023 09:03 AM   Modules accepted: Orders

## 2023-10-28 NOTE — Assessment & Plan Note (Signed)
 Ordered lipid panel, pending results. Work on low cholesterol diet and exercise as tolerated

## 2023-10-28 NOTE — Progress Notes (Signed)
 Assessment & Plan:  Pre-op exam Assessment & Plan: EKG cxr not needed pt does not meet criteria  Reviewed EKG 1/25  Pending lab work however overall low risk and likely cleared pending results   Orders: -     Protime-INR -     Comprehensive metabolic panel -     CBC -     Urine Culture -     POCT urine pregnancy; Future  Accidental acetaminophen overdose, subsequent encounter  Pain, dental Assessment & Plan: Pending dental surgery    History of alcohol abuse -     Comprehensive metabolic panel  Chronic ITP (idiopathic thrombocytopenia) (HCC) Assessment & Plan: Stable Will continue to monitor, cbc ordered today pendin gresults   Familial hypercholesterolemia Assessment & Plan: Ordered lipid panel, pending results. Work on low cholesterol diet and exercise as tolerated   Orders: -     Lipid panel  Obesity (BMI 30-39.9) Assessment & Plan: Pt advised to work on diet and exercise as tolerated       I have independently evaluated patient.  Cheyenne Barnes is a 28 y.o. female who is low risk for a low risk surgery.  There are not modifiable risk factors (smoking, etc). Cheyenne Barnes's RCRI/NSQIP calculation for MACE is: 0.    Review meds: If indicated, NO ACE-I/ARB day of surgery. OK to do BB or statin day of (esp if vascular sx)  Return if symptoms worsen or fail to improve.  Mort Sawyers, MSN, APRN, FNP-C [facility]  Subjective:      HPI: Pt is a 28 y.o. female who is here for preoperative clearance for dental extraction under general anesthesia   1) High Risk Cardiac Conditions:  1) Recent MI - No.  2) Decompensated Heart Failure - No.  3) Unstable angina - No.  4) Symptomatic arrythmia - No.  5) Sx Valvular Disease - No.  2) Intermediate Risk Factors: DM, CKD, CVA, CHF, CAD - No.  2) Functional Status: > 4 mets (Walk, run, climb stairs) Yes.  Cheyenne Barnes   3) Surgery Specific Risk:            Low (Endoscopic,  Cataract, Breast )  4) Further Noninvasive evaluation:   1) EKG - No., reviewed EKG 06/29/23 NSR   Hx of MI, CVA, CAD, DM, CKD  2) Echo - No.   Worsening dyspnea or CHF without an echo in the past year  3) Stress Testing - Active Cardiac Disease - No.  4) CXR No.   If asymptomatic, healthy, no respiratory symptoms it is not needed  5)PFTs No.   OSA, OHS, or significant cardiopulmonary history  5) Need for medical therapy - Beta Blocker, Statins indicated ? No.  New complaints: No new complaints  H/o tylenol overdose, fall 2024, incidental. Was positive for opiate during hospital stay with UDS but was given morphine at the time of visit.   Social history:  Relevant past medical, surgical, family and social history reviewed and updated as indicated. Interim medical history since our last visit reviewed.  Allergies and medications reviewed and updated.  DATA REVIEWED: CHART IN EPIC  ROS: Negative unless specifically indicated above in HPI.    Current Outpatient Medications:    alum & mag hydroxide-simeth (MAALOX/MYLANTA) 200-200-20 MG/5ML suspension, Take 30 mLs by mouth every 4 (four) hours as needed for indigestion., Disp: , Rfl:    amoxicillin-clavulanate (AUGMENTIN) 500-125 MG tablet, Take 1 tablet by mouth 3 (three) times daily., Disp: ,  Rfl:    APPLE CIDER VINEGAR PO, Take 1 tablet by mouth daily., Disp: , Rfl:    Bacillus Coagulans-Inulin (PROBIOTIC-PREBIOTIC PO), Take 1 capsule by mouth daily., Disp: , Rfl:    BIOTIN PO, Take 1 tablet by mouth daily., Disp: , Rfl:    famotidine (PEPCID) 10 MG tablet, Take 10 mg by mouth 2 (two) times daily., Disp: , Rfl:    famotidine (PEPCID) 20 MG tablet, Take 1 tablet (20 mg total) by mouth 2 (two) times daily for 14 days., Disp: 28 tablet, Rfl: 0   Multiple Vitamins-Minerals (MULTIVITAMIN WOMEN) TABS, Take 1 tablet by mouth daily. Woman's Multivitamin + Omega 3, Disp: , Rfl:    ondansetron (ZOFRAN) 4 MG tablet, Take 1 tablet (4 mg  total) by mouth every 6 (six) hours as needed for nausea., Disp: 20 tablet, Rfl: 0   pantoprazole (PROTONIX) 40 MG tablet, Take 40 mg by mouth daily., Disp: , Rfl:    traMADol (ULTRAM) 50 MG tablet, Take 1 tablet (50 mg total) by mouth every 6 (six) hours as needed for severe pain (pain score 7-10) or moderate pain (pain score 4-6)., Disp: 30 tablet, Rfl: 0      Objective:    BP 104/66 (BP Location: Left Arm, Patient Position: Sitting, Cuff Size: Normal)   Pulse 61   Temp 98.1 F (36.7 C) (Temporal)   Ht 5\' 3"  (1.6 m)   Wt 172 lb (78 kg)   LMP 09/13/2023 (Exact Date)   SpO2 99%   BMI 30.47 kg/m   Wt Readings from Last 3 Encounters:  10/28/23 172 lb (78 kg)  10/14/23 174 lb (78.9 kg)  07/04/23 187 lb 3.2 oz (84.9 kg)    Physical Exam Constitutional:      General: She is not in acute distress.    Appearance: Normal appearance. She is normal weight. She is not ill-appearing.  HENT:     Head: Normocephalic.     Right Ear: Tympanic membrane normal.     Left Ear: Tympanic membrane normal.     Nose: Nose normal.     Mouth/Throat:     Mouth: Mucous membranes are moist.  Eyes:     Extraocular Movements: Extraocular movements intact.     Pupils: Pupils are equal, round, and reactive to light.  Cardiovascular:     Rate and Rhythm: Normal rate and regular rhythm.  Pulmonary:     Effort: Pulmonary effort is normal.     Breath sounds: Normal breath sounds.  Abdominal:     General: Abdomen is flat. Bowel sounds are normal.     Palpations: Abdomen is soft.     Tenderness: There is no guarding or rebound.  Musculoskeletal:        General: Normal range of motion.     Cervical back: Normal range of motion.  Skin:    General: Skin is warm.     Capillary Refill: Capillary refill takes less than 2 seconds.  Neurological:     General: No focal deficit present.     Mental Status: She is alert.  Psychiatric:        Mood and Affect: Mood normal.        Behavior: Behavior normal.         Thought Content: Thought content normal.        Judgment: Judgment normal.

## 2023-10-28 NOTE — Assessment & Plan Note (Signed)
 Pt advised to work on diet and exercise as tolerated

## 2023-10-28 NOTE — Assessment & Plan Note (Signed)
 Pending dental surgery

## 2023-10-30 LAB — URINE CULTURE
MICRO NUMBER:: 16202980
Result:: NO GROWTH
SPECIMEN QUALITY:: ADEQUATE

## 2023-10-31 ENCOUNTER — Encounter: Payer: Self-pay | Admitting: Family

## 2023-12-26 ENCOUNTER — Ambulatory Visit: Payer: No Typology Code available for payment source | Attending: Cardiology | Admitting: Cardiology

## 2023-12-30 ENCOUNTER — Encounter: Payer: Self-pay | Admitting: *Deleted

## 2024-07-09 ENCOUNTER — Telehealth: Admitting: Physician Assistant

## 2024-07-09 DIAGNOSIS — N760 Acute vaginitis: Secondary | ICD-10-CM | POA: Diagnosis not present

## 2024-07-09 DIAGNOSIS — B9689 Other specified bacterial agents as the cause of diseases classified elsewhere: Secondary | ICD-10-CM | POA: Diagnosis not present

## 2024-07-09 MED ORDER — METRONIDAZOLE 500 MG PO TABS: 500.0000 mg | ORAL_TABLET | Freq: Two times a day (BID) | ORAL | 0 refills | Status: AC

## 2024-07-09 NOTE — Progress Notes (Signed)

## 2024-09-12 ENCOUNTER — Other Ambulatory Visit (INDEPENDENT_AMBULATORY_CARE_PROVIDER_SITE_OTHER): Payer: Self-pay | Admitting: Radiology

## 2024-09-12 ENCOUNTER — Telehealth: Admitting: Family

## 2024-09-12 ENCOUNTER — Other Ambulatory Visit

## 2024-09-12 ENCOUNTER — Ambulatory Visit: Payer: Self-pay | Admitting: Family

## 2024-09-12 DIAGNOSIS — E049 Nontoxic goiter, unspecified: Secondary | ICD-10-CM

## 2024-09-12 DIAGNOSIS — G4452 New daily persistent headache (NDPH): Secondary | ICD-10-CM

## 2024-09-12 DIAGNOSIS — D693 Immune thrombocytopenic purpura: Secondary | ICD-10-CM | POA: Diagnosis not present

## 2024-09-12 DIAGNOSIS — N926 Irregular menstruation, unspecified: Secondary | ICD-10-CM

## 2024-09-12 DIAGNOSIS — K921 Melena: Secondary | ICD-10-CM

## 2024-09-12 DIAGNOSIS — R233 Spontaneous ecchymoses: Secondary | ICD-10-CM

## 2024-09-12 DIAGNOSIS — R5383 Other fatigue: Secondary | ICD-10-CM

## 2024-09-12 LAB — CBC WITH DIFFERENTIAL/PLATELET
Basophils Absolute: 0 10*3/uL (ref 0.0–0.1)
Basophils Relative: 0.9 % (ref 0.0–3.0)
Eosinophils Absolute: 0.1 10*3/uL (ref 0.0–0.7)
Eosinophils Relative: 1.7 % (ref 0.0–5.0)
HCT: 41.3 % (ref 36.0–46.0)
Hemoglobin: 13.8 g/dL (ref 12.0–15.0)
Lymphocytes Relative: 25.9 % (ref 12.0–46.0)
Lymphs Abs: 1.5 10*3/uL (ref 0.7–4.0)
MCHC: 33.5 g/dL (ref 30.0–36.0)
MCV: 96.9 fl (ref 78.0–100.0)
Monocytes Absolute: 0.4 10*3/uL (ref 0.1–1.0)
Monocytes Relative: 7.8 % (ref 3.0–12.0)
Neutro Abs: 3.6 10*3/uL (ref 1.4–7.7)
Neutrophils Relative %: 63.7 % (ref 43.0–77.0)
Platelets: 192 10*3/uL (ref 150.0–400.0)
RBC: 4.26 Mil/uL (ref 3.87–5.11)
RDW: 13.2 % (ref 11.5–15.5)
WBC: 5.6 10*3/uL (ref 4.0–10.5)

## 2024-09-12 LAB — BASIC METABOLIC PANEL WITH GFR
BUN: 21 mg/dL (ref 6–23)
CO2: 30 meq/L (ref 19–32)
Calcium: 9.1 mg/dL (ref 8.4–10.5)
Chloride: 101 meq/L (ref 96–112)
Creatinine, Ser: 0.57 mg/dL (ref 0.40–1.20)
GFR: 121.94 mL/min
Glucose, Bld: 69 mg/dL — ABNORMAL LOW (ref 70–99)
Potassium: 4.5 meq/L (ref 3.5–5.1)
Sodium: 136 meq/L (ref 135–145)

## 2024-09-12 LAB — TSH: TSH: 0.74 u[IU]/mL (ref 0.35–5.50)

## 2024-09-12 LAB — FECAL OCCULT BLOOD, IMMUNOCHEMICAL: Fecal Occult Bld: NEGATIVE

## 2024-09-12 LAB — VITAMIN B12: Vitamin B-12: 642 pg/mL (ref 211–911)

## 2024-09-12 LAB — SEDIMENTATION RATE: Sed Rate: 12 mm/h (ref 0–20)

## 2024-09-12 LAB — C-REACTIVE PROTEIN: CRP: <0.5 mg/dL — ABNORMAL LOW (ref 1.0–20.0)

## 2024-09-12 NOTE — Progress Notes (Addendum)
 "  Established Patient Office Visit  Subjective:      CC:  Chief Complaint  Patient presents with   Acute Visit    HPI: Cheyenne Barnes is a 29 y.o. female presenting on 09/12/2024 for Acute Visit . Virtual Visit via Video note  I connected with KYANI SIMKIN on 09/17/24 at home by video and verified that I am speaking with the correct person using two identifiers.The provider, Ginger Patrick, FNP is located in their home at time of visit.  I discussed the limitations, risks, security and privacy concerns of performing an evaluation and management service by video and the availability of in person appointments. I also discussed with the patient that there may be a patient responsible charge related to this service. The patient expressed understanding and agreed to proceed.   Discussed the use of AI scribe software for clinical note transcription with the patient, who gave verbal consent to proceed.  History of Present Illness Cheyenne Barnes is a 29 year old female with ITP who presents with petechiae and easy bruising.  She noticed petechiae on both legs over the weekend, with multiple spots on both the left and right legs. She also has significant bruising that persists for days to weeks, including a dark blue bruise the size of a golf ball on her leg that has been present for two weeks, as well as bruises on her shoulder and thigh.  She has been experiencing unusual headaches, initially attributed to dehydration from frequent gym workouts. Despite increasing her electrolyte intake, the headaches persist. They are described as faint and located in the front of her head, lasting up to two days at a time. No visual disturbances, but occasional dizziness is noted.  Her menstrual periods have been heavier than usual, and she reports significant gum bleeding despite maintaining good oral hygiene. She brushes her teeth three to four times daily and uses a water pick.  She was  diagnosed with ITP in 2015 and has not experienced a flare-up in years. She recalls similar symptoms when first diagnosed, though the bruising is not as severe as it was initially. She recalls being treated with prednisone  during past ITP flare-ups.  No epistaxis, but she mentions blood in her stool, which she attributes to constipation. She has been feeling unusually fatigued, despite maintaining a busy schedule that includes work, gym, and school. She reports falling asleep earlier than usual and feeling exhausted.         Social history:  Relevant past medical, surgical, family and social history reviewed and updated as indicated. Interim medical history since our last visit reviewed.  Allergies and medications reviewed and updated.  DATA REVIEWED: CHART IN EPIC     ROS: Negative unless specifically indicated above in HPI.   Current Medications[1]        Objective:        There were no vitals taken for this visit.  Physical Exam   Wt Readings from Last 3 Encounters:  10/28/23 172 lb (78 kg)  10/14/23 174 lb (78.9 kg)  07/04/23 187 lb 3.2 oz (84.9 kg)    Physical Exam Vitals reviewed.  Constitutional:      General: She is not in acute distress.    Appearance: Normal appearance. She is not ill-appearing.  Pulmonary:     Effort: Pulmonary effort is normal.  Neurological:     General: No focal deficit present.     Mental Status: She is alert and oriented to person,  place, and time.  Psychiatric:        Mood and Affect: Mood normal.        Behavior: Behavior normal.        Thought Content: Thought content normal.          Results   Assessment & Plan:   Assessment and Plan Assessment & Plan Chronic immune thrombocytopenic purpura (ITP) Recurrent symptoms suggestive of ITP flare, including petechiae, easy bruising, and gum bleeding. No recent epistaxis. Differential includes other bleeding disorders, but ITP is most likely given history. Previous  treatment with prednisone  noted. - Ordered CBC to assess platelet count - Ordered CRP and sed rate to evaluate for inflammation - Ordered TSH to rule out thyroid  dysfunction - If platelet count is low, will initiate urgent referral to hematology - If platelet count is low, will start prednisone  - Advised to monitor for major signs of bleeding - Provided fecal occult blood stool card for home use to check for gastrointestinal bleeding  Abnormal uterine bleeding Heavy menstrual bleeding, potentially contributing to fatigue and anemia. Differential includes ITP-related bleeding. - Ordered CBC to assess for anemia  New daily persistent headache Intermittent frontal headaches over the past two weeks, not associated with sinus issues or visual changes. Possible dehydration or electrolyte imbalance considered, but not confirmed. No aura or severe pain reported. - Ordered CBC to assess for anemia - Ordered CRP and sed rate to evaluate for inflammation - Ordered TSH to rule out thyroid  dysfunction  Fatigue Significant fatigue over the past few weeks, possibly related to ITP flare or anemia. No recent illness or sinus issues reported. - Ordered CBC to assess for anemia - Ordered CRP and sed rate to evaluate for inflammation - Ordered TSH to rule out thyroid  dysfunction        Return in about 2 weeks (around 09/26/2024) for follow up bruising .     Ginger Patrick, MSN, APRN, FNP-C [facility]        [1]  Current Outpatient Medications:    famotidine  (PEPCID ) 20 MG tablet, Take 1 tablet (20 mg total) by mouth 2 (two) times daily for 14 days. (Patient not taking: Reported on 09/12/2024), Disp: 28 tablet, Rfl: 0   Multiple Vitamins-Minerals (MULTIVITAMIN WOMEN) TABS, Take 1 tablet by mouth daily. Woman's Multivitamin + Omega 3 (Patient not taking: Reported on 09/12/2024), Disp: , Rfl:    pantoprazole  (PROTONIX ) 40 MG tablet, Take 40 mg by mouth daily. (Patient  not taking: Reported on 09/12/2024), Disp: , Rfl:   "

## 2024-09-13 ENCOUNTER — Ambulatory Visit: Payer: Self-pay | Admitting: Family

## 2024-09-13 ENCOUNTER — Ambulatory Visit

## 2024-09-13 DIAGNOSIS — R5383 Other fatigue: Secondary | ICD-10-CM | POA: Diagnosis not present

## 2024-09-13 LAB — IBC + FERRITIN
Ferritin: 10.2 ng/mL (ref 10.0–291.0)
Iron: 90 ug/dL (ref 42–145)
Saturation Ratios: 22.6 % (ref 20.0–50.0)
TIBC: 399 ug/dL (ref 250.0–450.0)
Transferrin: 285 mg/dL (ref 212.0–360.0)

## 2024-09-13 NOTE — Telephone Encounter (Signed)
 Can we add on ibc ferritin
